# Patient Record
Sex: Female | Born: 1950 | Race: White | Hispanic: No | Marital: Married | Smoking: Never smoker
Health system: Southern US, Community
[De-identification: ages and names within clinical notes are randomized; demographics above are authoritative.]

## PROBLEM LIST (undated history)

## (undated) DIAGNOSIS — E559 Vitamin D deficiency, unspecified: Secondary | ICD-10-CM

## (undated) DIAGNOSIS — R51 Headache: Secondary | ICD-10-CM

## (undated) DIAGNOSIS — K219 Gastro-esophageal reflux disease without esophagitis: Secondary | ICD-10-CM

## (undated) DIAGNOSIS — M5090 Cervical disc disorder, unspecified, unspecified cervical region: Secondary | ICD-10-CM

## (undated) DIAGNOSIS — R112 Nausea with vomiting, unspecified: Secondary | ICD-10-CM

## (undated) DIAGNOSIS — I10 Essential (primary) hypertension: Secondary | ICD-10-CM

## (undated) DIAGNOSIS — R0602 Shortness of breath: Secondary | ICD-10-CM

## (undated) DIAGNOSIS — L723 Sebaceous cyst: Secondary | ICD-10-CM

## (undated) DIAGNOSIS — N200 Calculus of kidney: Secondary | ICD-10-CM

## (undated) DIAGNOSIS — W19XXXA Unspecified fall, initial encounter: Secondary | ICD-10-CM

## (undated) DIAGNOSIS — R32 Unspecified urinary incontinence: Secondary | ICD-10-CM

## (undated) DIAGNOSIS — Z85828 Personal history of other malignant neoplasm of skin: Secondary | ICD-10-CM

## (undated) DIAGNOSIS — R058 Other specified cough: Secondary | ICD-10-CM

## (undated) DIAGNOSIS — R05 Cough: Secondary | ICD-10-CM

## (undated) DIAGNOSIS — Z9889 Other specified postprocedural states: Secondary | ICD-10-CM

## (undated) DIAGNOSIS — I2510 Atherosclerotic heart disease of native coronary artery without angina pectoris: Secondary | ICD-10-CM

## (undated) DIAGNOSIS — G4730 Sleep apnea, unspecified: Secondary | ICD-10-CM

## (undated) DIAGNOSIS — M1990 Unspecified osteoarthritis, unspecified site: Secondary | ICD-10-CM

## (undated) DIAGNOSIS — M797 Fibromyalgia: Secondary | ICD-10-CM

## (undated) DIAGNOSIS — R3129 Other microscopic hematuria: Secondary | ICD-10-CM

## (undated) DIAGNOSIS — F419 Anxiety disorder, unspecified: Secondary | ICD-10-CM

## (undated) DIAGNOSIS — M1710 Unilateral primary osteoarthritis, unspecified knee: Secondary | ICD-10-CM

## (undated) HISTORY — DX: Cervical disc disorder, unspecified, unspecified cervical region: M50.90

## (undated) HISTORY — PX: DILATION AND CURETTAGE OF UTERUS: SHX78

## (undated) HISTORY — DX: Unspecified urinary incontinence: R32

## (undated) HISTORY — PX: ROTATOR CUFF REPAIR: SHX139

## (undated) HISTORY — PX: CYST EXCISION: SHX5701

## (undated) HISTORY — DX: Vitamin D deficiency, unspecified: E55.9

## (undated) HISTORY — DX: Unspecified fall, initial encounter: W19.XXXA

## (undated) HISTORY — PX: JOINT REPLACEMENT: SHX530

## (undated) HISTORY — DX: Atherosclerotic heart disease of native coronary artery without angina pectoris: I25.10

## (undated) HISTORY — DX: Sebaceous cyst: L72.3

## (undated) HISTORY — PX: TONSILLECTOMY: SUR1361

## (undated) HISTORY — DX: Unilateral primary osteoarthritis, unspecified knee: M17.10

---

## 2001-02-28 ENCOUNTER — Encounter: Payer: Self-pay | Admitting: Obstetrics and Gynecology

## 2001-02-28 ENCOUNTER — Encounter: Admission: RE | Admit: 2001-02-28 | Discharge: 2001-02-28 | Payer: Self-pay | Admitting: Obstetrics and Gynecology

## 2001-03-07 ENCOUNTER — Encounter: Payer: Self-pay | Admitting: Obstetrics and Gynecology

## 2001-03-07 ENCOUNTER — Encounter: Admission: RE | Admit: 2001-03-07 | Discharge: 2001-03-07 | Payer: Self-pay | Admitting: Obstetrics and Gynecology

## 2001-10-03 ENCOUNTER — Encounter: Payer: Self-pay | Admitting: Obstetrics and Gynecology

## 2001-10-03 ENCOUNTER — Encounter: Admission: RE | Admit: 2001-10-03 | Discharge: 2001-10-03 | Payer: Self-pay | Admitting: Obstetrics and Gynecology

## 2001-10-11 ENCOUNTER — Encounter: Admission: RE | Admit: 2001-10-11 | Discharge: 2001-10-11 | Payer: Self-pay | Admitting: Obstetrics and Gynecology

## 2001-10-11 ENCOUNTER — Encounter: Payer: Self-pay | Admitting: Obstetrics and Gynecology

## 2001-11-13 ENCOUNTER — Encounter: Admission: RE | Admit: 2001-11-13 | Discharge: 2001-11-13 | Payer: Self-pay | Admitting: Obstetrics and Gynecology

## 2001-11-13 ENCOUNTER — Encounter: Payer: Self-pay | Admitting: Obstetrics and Gynecology

## 2004-06-28 ENCOUNTER — Encounter: Admission: RE | Admit: 2004-06-28 | Discharge: 2004-06-28 | Payer: Self-pay | Admitting: Obstetrics and Gynecology

## 2004-12-21 ENCOUNTER — Ambulatory Visit (HOSPITAL_COMMUNITY): Admission: AD | Admit: 2004-12-21 | Discharge: 2004-12-23 | Payer: Self-pay | Admitting: Orthopedic Surgery

## 2004-12-29 ENCOUNTER — Ambulatory Visit: Admission: RE | Admit: 2004-12-29 | Discharge: 2004-12-29 | Payer: Self-pay | Admitting: Orthopedic Surgery

## 2005-03-23 ENCOUNTER — Other Ambulatory Visit: Admission: RE | Admit: 2005-03-23 | Discharge: 2005-03-23 | Payer: Self-pay | Admitting: Obstetrics and Gynecology

## 2005-09-21 ENCOUNTER — Encounter: Admission: RE | Admit: 2005-09-21 | Discharge: 2005-09-21 | Payer: Self-pay | Admitting: Obstetrics and Gynecology

## 2006-03-01 ENCOUNTER — Ambulatory Visit (HOSPITAL_COMMUNITY): Admission: RE | Admit: 2006-03-01 | Discharge: 2006-03-01 | Payer: Self-pay | Admitting: Orthopedic Surgery

## 2006-10-22 ENCOUNTER — Encounter: Admission: RE | Admit: 2006-10-22 | Discharge: 2006-10-22 | Payer: Self-pay | Admitting: Obstetrics and Gynecology

## 2007-02-04 ENCOUNTER — Ambulatory Visit: Payer: Self-pay | Admitting: Pulmonary Disease

## 2007-02-04 ENCOUNTER — Ambulatory Visit: Payer: Self-pay | Admitting: Cardiovascular Disease

## 2007-02-04 ENCOUNTER — Inpatient Hospital Stay (HOSPITAL_COMMUNITY): Admission: RE | Admit: 2007-02-04 | Discharge: 2007-02-09 | Payer: Self-pay | Admitting: Orthopedic Surgery

## 2007-02-21 ENCOUNTER — Ambulatory Visit: Payer: Self-pay | Admitting: Pulmonary Disease

## 2007-02-22 ENCOUNTER — Ambulatory Visit (HOSPITAL_BASED_OUTPATIENT_CLINIC_OR_DEPARTMENT_OTHER): Admission: RE | Admit: 2007-02-22 | Discharge: 2007-02-22 | Payer: Self-pay | Admitting: Pulmonary Disease

## 2007-02-22 ENCOUNTER — Ambulatory Visit: Payer: Self-pay | Admitting: Pulmonary Disease

## 2007-03-04 ENCOUNTER — Encounter: Admission: RE | Admit: 2007-03-04 | Discharge: 2007-03-18 | Payer: Self-pay | Admitting: Orthopedic Surgery

## 2007-03-19 ENCOUNTER — Encounter: Admission: RE | Admit: 2007-03-19 | Discharge: 2007-06-17 | Payer: Self-pay | Admitting: Orthopedic Surgery

## 2007-04-29 ENCOUNTER — Ambulatory Visit (HOSPITAL_BASED_OUTPATIENT_CLINIC_OR_DEPARTMENT_OTHER): Admission: RE | Admit: 2007-04-29 | Discharge: 2007-04-29 | Payer: Self-pay | Admitting: Orthopedic Surgery

## 2007-05-21 ENCOUNTER — Encounter: Admission: RE | Admit: 2007-05-21 | Discharge: 2007-08-19 | Payer: Self-pay | Admitting: Orthopedic Surgery

## 2008-03-13 ENCOUNTER — Encounter: Admission: RE | Admit: 2008-03-13 | Discharge: 2008-03-13 | Payer: Self-pay | Admitting: Obstetrics and Gynecology

## 2008-03-23 ENCOUNTER — Other Ambulatory Visit: Admission: RE | Admit: 2008-03-23 | Discharge: 2008-03-23 | Payer: Self-pay | Admitting: Obstetrics and Gynecology

## 2009-04-12 ENCOUNTER — Other Ambulatory Visit: Admission: RE | Admit: 2009-04-12 | Discharge: 2009-04-12 | Payer: Self-pay | Admitting: Obstetrics and Gynecology

## 2010-02-11 ENCOUNTER — Ambulatory Visit (HOSPITAL_BASED_OUTPATIENT_CLINIC_OR_DEPARTMENT_OTHER): Admission: RE | Admit: 2010-02-11 | Discharge: 2010-02-11 | Payer: Self-pay | Admitting: General Surgery

## 2010-04-13 ENCOUNTER — Other Ambulatory Visit: Admission: RE | Admit: 2010-04-13 | Discharge: 2010-04-13 | Payer: Self-pay | Admitting: Obstetrics and Gynecology

## 2010-12-05 LAB — DIFFERENTIAL
Lymphocytes Relative: 30 % (ref 12–46)
Monocytes Absolute: 0.6 10*3/uL (ref 0.1–1.0)
Monocytes Relative: 7 % (ref 3–12)
Neutro Abs: 5.2 10*3/uL (ref 1.7–7.7)
Neutrophils Relative %: 61 % (ref 43–77)

## 2010-12-05 LAB — CBC
HCT: 41.3 % (ref 36.0–46.0)
MCHC: 33.3 g/dL (ref 30.0–36.0)
MCV: 92.3 fL (ref 78.0–100.0)
Platelets: 267 10*3/uL (ref 150–400)
RDW: 13.2 % (ref 11.5–15.5)

## 2010-12-05 LAB — BASIC METABOLIC PANEL
Calcium: 9 mg/dL (ref 8.4–10.5)
GFR calc Af Amer: >60 mL/min (ref >60)
Glucose, Bld: 147 mg/dL — ABNORMAL HIGH (ref 70–99)
Potassium: 4.1 mEq/L (ref 3.5–5.1)

## 2010-12-05 LAB — GLUCOSE, CAPILLARY: Glucose-Capillary: 190 mg/dL — ABNORMAL HIGH (ref 70–99)

## 2010-12-07 ENCOUNTER — Encounter (HOSPITAL_BASED_OUTPATIENT_CLINIC_OR_DEPARTMENT_OTHER)
Admission: RE | Admit: 2010-12-07 | Discharge: 2010-12-07 | Disposition: A | Discharge status: Home / Self Care | Payer: Worker's Compensation | Source: Ambulatory Visit | Attending: Orthopedic Surgery | Admitting: Orthopedic Surgery

## 2010-12-07 LAB — BASIC METABOLIC PANEL
BUN: 11 mg/dL (ref 6–23)
Calcium: 9.2 mg/dL (ref 8.4–10.5)
Creatinine, Ser: 0.58 mg/dL (ref 0.4–1.2)
GFR calc non Af Amer: >60 mL/min (ref >60)
Potassium: 4.8 mEq/L (ref 3.5–5.1)

## 2010-12-08 ENCOUNTER — Ambulatory Visit (HOSPITAL_BASED_OUTPATIENT_CLINIC_OR_DEPARTMENT_OTHER)
Admission: RE | Admit: 2010-12-08 | Discharge: 2010-12-08 | Disposition: A | Discharge status: Home / Self Care | Payer: Worker's Compensation | Source: Ambulatory Visit | Attending: Orthopedic Surgery | Admitting: Orthopedic Surgery

## 2010-12-08 DIAGNOSIS — M24119 Other articular cartilage disorders, unspecified shoulder: Secondary | ICD-10-CM | POA: Insufficient documentation

## 2010-12-08 DIAGNOSIS — Z01812 Encounter for preprocedural laboratory examination: Secondary | ICD-10-CM | POA: Insufficient documentation

## 2010-12-08 DIAGNOSIS — I10 Essential (primary) hypertension: Secondary | ICD-10-CM | POA: Insufficient documentation

## 2010-12-08 DIAGNOSIS — G4730 Sleep apnea, unspecified: Secondary | ICD-10-CM | POA: Insufficient documentation

## 2010-12-08 DIAGNOSIS — M25819 Other specified joint disorders, unspecified shoulder: Secondary | ICD-10-CM | POA: Insufficient documentation

## 2010-12-08 DIAGNOSIS — M19019 Primary osteoarthritis, unspecified shoulder: Secondary | ICD-10-CM | POA: Insufficient documentation

## 2010-12-08 DIAGNOSIS — E119 Type 2 diabetes mellitus without complications: Secondary | ICD-10-CM | POA: Insufficient documentation

## 2010-12-08 DIAGNOSIS — E669 Obesity, unspecified: Secondary | ICD-10-CM | POA: Insufficient documentation

## 2010-12-08 LAB — GLUCOSE, CAPILLARY: Glucose-Capillary: 164 mg/dL — ABNORMAL HIGH (ref 70–99)

## 2010-12-09 NOTE — Op Note (Signed)
NAME:  Denise [name], Denise [name]              ACCOUNT NO.:  [id number]  MEDICAL RECORD NO.:  [id number]          PATIENT TYPE:  LOCATION:                                 FACILITY:  PHYSICIAN:  Jones Broom, MD         DATE OF BIRTH:22-May-2051  DATE OF PROCEDURE:  12/08/2010 DATE OF DISCHARGE:                              OPERATIVE REPORT   PREOPERATIVE DIAGNOSES: 1. Right shoulder impingement. 2. Right shoulder acromioclavicular joint degenerative joint disease.  POSTOPERATIVE DIAGNOSES: 1. Right shoulder impingement. 2. Right shoulder acromioclavicular joint degenerative disease. 3. Right shoulder type 2 superior labrum anterior and posterior tear.  PROCEDURES PERFORMED: 1. Right arthroscopic distal clavicle excision. 2. Right arthroscopic subacromial decompression. 3. Right arthroscopic biceps tenodesis and debridement of type 2     superior labrum anterior and posterior tear and degenerative     anterior labral tear and partial-thickness rotator cuff tear.  ATTENDING SURGEON:  Jones Broom, MD  ASSISTANT:  Jason Coop  ANESTHESIA:  GETA with preoperative interscalene block.  COMPLICATIONS:  None.  DRAINS:  None.  SPECIMENS:  None.  ESTIMATED BLOOD LOSS:  Minimal.  INDICATIONS FOR SURGERY:  The patient is a 60 year-old female who was injured at work in March 2009.  She had continued pain in the right shoulder which failed nonoperative management with icing, rest, anti- inflammatories, injections and physical therapy.  She had an MRI which demonstrated some partial-thickness rotator cuff tearing and signs of chronic impingement with a large anterior acromial spur as well as significant AC joint degenerative changes.  She is indicated for arthroscopic exam with subacromial decompression, distal clavicle excision and possible rotator cuff repair and other findings as indicated.  She understood risks, benefits and alternatives of the procedure including but not limited to  the risk of bleeding, infection, damage to neurovascular structures and risk of anesthesia.  She elected to go forward with surgery.  OPERATIVE FINDINGS:  Examination under anesthesia demonstrated no stiffness or instability.  Diagnostic arthroscopy revealed extensive tearing of the superior labrum which was completely detached from the biceps anchor at the superior glenoid tubercle.  The proximal aspect of the biceps tendon was hyperemic.  No loose bodies were noted in the joint.  Glenohumeral joint surfaces were intact and no significant chondromalacia.  The subscapularis was intact anteriorly.  Posterior rotator cuff was intact.  Superior rotator cuff had some partial- thickness undersurface tearing which was debrided back to stable base. A PDS suture was used to tag the area of most significant partial- thickness tearing to look at the bursal surface.  In the bursal surface, she was noted to have extensive bursitis which was debrided exposing the underlying bursal surface of rotator cuff which was carefully examined. The PDS suture was found and the rotator cuff was carefully probed and this area found to be completely intact with no significant thinning. Anterior and posterior rotator cuff were carefully examined and found to be completely intact.  She was noted to have a very large anterior acromial spur which was taken down with standard acromioplasty from the lateral portal and then from the posterior portal in a  cutting block technique ensuring that the acromion was completely flat from posterior to anterior.  AC joint was found to be significant degenerative and standard distal clavicle excision was performed.  PROCEDURE PERFORMED:  The patient was identified in the preoperative holding area where I personally marked out the operative site after verifying site side and procedure with the patient.  She was taken back to the operating room after interscalene block was given by  the attending anesthesiologist.  She was placed on the operative room table and general anesthesia was induced without complication.  She was placed in a beach-chair position with the head and neck and opposite extremity carefully padded and positioned.  Right upper extremity was then prepped and draped in a standard sterile fashion.  The patient did receive 2 g IV Ancef prior to the procedure.  At appropriate time-out, procedure was carried out myself, the operative staff and the anesthesia staff all verifying site side and procedure.  Standard posterior portal was then established and the arthroscope was introduced into the joint.  Anterior portal was then established with needle localization.  A small cannula was placed just above the subscapularis.  Diagnostic arthroscopy was then carried out with findings as described above.  Collene Mares was introduced through the anterior portal to debride the superior labrum. Probe was used to demonstrate the superior labrum was completely detached from the supraglenoid tubercle and therefore given the findings of the biceps tendon as well, decision was made to perform biceps tenotomy rather than considering a superior labral repair in this patient with her age group.  Tenotomy was carried out with a large basket.  The remainder of the arthroscopic exam as described above. Collene Mares was used through the anterior portal, debrided partial-thickness undersurface rotator cuff tearing.  There was not significant exposed tuberosity.  A PDS tag stitch was used percutaneously to tack the area of greatest concern and the arthroscope was then introduced in the subacromial space.  Extensive bursectomy was performed through lateral portal which was established with needle localization.  The PDS suture was found in the area and the anterior rotator cuff was carefully probed and found to have no significant thinning.  No rotator cuff repair was deemed necessary.  The  entire cuff was exposed and felt to be completely intact on the bursal surface.  The coracoacromial ligament was then taken down exposing a very large anterior acromial spur.  A 4-mm bur was used from the lateral port to carry out a standard acromioplasty creating a type 1 acromion from a type 3 acromion.  The bur was used through the posterior portal viewing through lateral portal as well in a cutting block technique to ensure that the acromion was completely flat from the back to the front.  The distal clavicle was then exposed using combination of shaver and [employer]Care.  The anterior cannula was then moved to the [facility] joint.  The bur was first used to the lateral portal to take off the undersurface of the distal clavicle for a distance of about 8 mm and then through the anterior portal to complete the resection in a smooth even fashion.  The final resection was viewed from anterolateral and posterior portals was then felt to be excellent.  The shaver was then used again to clean up any excess bone dust in the joint and arthroscopic equipment was then removed from the joint.  Portals were closed with 3-0 nylon in interrupted fashion.  Sterile dressings were then applied including Xeroform, 4x4s, ABDs  and tape.  The patient was placed in a sling, allowed to awaken from general anesthesia, transferred to stretcher and taken to the recovery room in stable condition.  POSTOPERATIVE PLAN:  She will be discharged to home today in stable condition with her family.  She will follow up in 1 week for suture removal and wound check.  She will have Percocet for pain control.     Jones Broom, MD     JC/MEDQ  D:  12/08/2010  T:  12/09/2010  Job:  [id number]  Electronically Signed by Jones Broom  on 12/09/2010 11:18:52 AM

## 2010-12-21 ENCOUNTER — Ambulatory Visit: Payer: Worker's Compensation | Attending: Orthopedic Surgery | Admitting: Rehabilitation

## 2010-12-21 DIAGNOSIS — M25619 Stiffness of unspecified shoulder, not elsewhere classified: Secondary | ICD-10-CM | POA: Insufficient documentation

## 2010-12-21 DIAGNOSIS — M6281 Muscle weakness (generalized): Secondary | ICD-10-CM | POA: Insufficient documentation

## 2010-12-21 DIAGNOSIS — IMO0001 Reserved for inherently not codable concepts without codable children: Secondary | ICD-10-CM | POA: Insufficient documentation

## 2010-12-21 DIAGNOSIS — M25519 Pain in unspecified shoulder: Secondary | ICD-10-CM | POA: Insufficient documentation

## 2010-12-26 ENCOUNTER — Ambulatory Visit: Payer: Worker's Compensation | Admitting: Physical Therapy

## 2011-01-06 ENCOUNTER — Ambulatory Visit: Payer: Worker's Compensation | Attending: Orthopedic Surgery | Admitting: Physical Therapy

## 2011-01-06 DIAGNOSIS — M25519 Pain in unspecified shoulder: Secondary | ICD-10-CM | POA: Insufficient documentation

## 2011-01-06 DIAGNOSIS — M6281 Muscle weakness (generalized): Secondary | ICD-10-CM | POA: Insufficient documentation

## 2011-01-06 DIAGNOSIS — M25619 Stiffness of unspecified shoulder, not elsewhere classified: Secondary | ICD-10-CM | POA: Insufficient documentation

## 2011-01-06 DIAGNOSIS — IMO0001 Reserved for inherently not codable concepts without codable children: Secondary | ICD-10-CM | POA: Insufficient documentation

## 2011-01-09 ENCOUNTER — Ambulatory Visit: Payer: Worker's Compensation | Admitting: Physical Therapy

## 2011-01-12 ENCOUNTER — Ambulatory Visit: Payer: Worker's Compensation | Admitting: Physical Therapy

## 2011-01-17 ENCOUNTER — Ambulatory Visit: Payer: Worker's Compensation | Attending: Orthopedic Surgery | Admitting: Physical Therapy

## 2011-01-17 DIAGNOSIS — M25619 Stiffness of unspecified shoulder, not elsewhere classified: Secondary | ICD-10-CM | POA: Insufficient documentation

## 2011-01-17 DIAGNOSIS — M6281 Muscle weakness (generalized): Secondary | ICD-10-CM | POA: Insufficient documentation

## 2011-01-17 DIAGNOSIS — M25519 Pain in unspecified shoulder: Secondary | ICD-10-CM | POA: Insufficient documentation

## 2011-01-17 DIAGNOSIS — IMO0001 Reserved for inherently not codable concepts without codable children: Secondary | ICD-10-CM | POA: Insufficient documentation

## 2011-01-19 ENCOUNTER — Ambulatory Visit: Payer: Worker's Compensation | Admitting: Physical Therapy

## 2011-01-23 ENCOUNTER — Ambulatory Visit: Payer: Worker's Compensation | Admitting: Physical Therapy

## 2011-01-23 ENCOUNTER — Ambulatory Visit: Payer: Worker's Compensation | Attending: Orthopedic Surgery | Admitting: Physical Therapy

## 2011-01-23 DIAGNOSIS — M25519 Pain in unspecified shoulder: Secondary | ICD-10-CM | POA: Insufficient documentation

## 2011-01-23 DIAGNOSIS — M6281 Muscle weakness (generalized): Secondary | ICD-10-CM | POA: Insufficient documentation

## 2011-01-23 DIAGNOSIS — IMO0001 Reserved for inherently not codable concepts without codable children: Secondary | ICD-10-CM | POA: Insufficient documentation

## 2011-01-23 DIAGNOSIS — M25619 Stiffness of unspecified shoulder, not elsewhere classified: Secondary | ICD-10-CM | POA: Insufficient documentation

## 2011-01-26 ENCOUNTER — Ambulatory Visit: Payer: Worker's Compensation | Admitting: Physical Therapy

## 2011-01-30 ENCOUNTER — Ambulatory Visit: Payer: Worker's Compensation | Admitting: Physical Therapy

## 2011-01-30 ENCOUNTER — Encounter: Payer: Self-pay | Admitting: Physical Therapy

## 2011-01-31 NOTE — Assessment & Plan Note (Signed)
[facility]                             PULMONARY OFFICE NOTE   NAME:Diffley, LYNISHA OSUCH                         MRN:          [id number]  DATE:03/27/2007                            DOB:          1951-04-09    I received download for Ms. Mullenax for [employer]. It appears that she had  excellent use with a 95th percentile pressure setting of 9 CMW with a  reduction in her apnea/hypopnea index of 1.7.  Ms. Shon says that she has  been doing very well on her CPAP.   I will therefore arrange for [facility] to set her CPAP machine at a fixed  pressure setting of 9 and follow up with her in the next several weeks  to further assess her tolerance for CPAP therapy.     Coralyn Helling, MD  Electronically Signed    VS/MedQ  DD: 03/26/2007  DT: 03/27/2007  Job #: [id number]   cc:   Deirdre Peer. Polite, M.D.  Gerrit Friends. Aldona Bar, M.D.

## 2011-01-31 NOTE — Procedures (Signed)
NAME:  Denise [name], Denise [name]                  ACCOUNT NO.:  [id number]   MEDICAL RECORD NO.:  [id number]          PATIENT TYPE:  OUT   LOCATION:  SLEEP CENTER                 FACILITY:  [facility]   PHYSICIAN:  Coralyn Helling, MD        DATE OF BIRTH:  [birth year]   DATE OF STUDY:                            NOCTURNAL POLYSOMNOGRAM   REFERRING PHYSICIAN:  Coralyn Helling, MD   INDICATION FOR STUDY:  This individual has a history of obstructive  sleep apnea and hypoxemia as well as hypertension and diabetes. She is  referred to the sleep lab for further evaluation of her sleep apnea.   EPWORTH SLEEPINESS SCORE:  Eight.   MEDICATIONS:  Allegra, Metformin, Quinapril, Norvasc.  The patient took  methocarbamol and Oxycodone on the night of the study.   SLEEP ARCHITECTURE:  Total recording time was 440 minutes.  Total sleep  time was 260 minutes.  Sleep efficiency was 59% which is reduced. Sleep  latency was 35 minutes which is prolonged.  REM latency was 128.5  minutes.  It appeared the patient had difficulty with sleep initiation  and sleep maintenance due to respiratory events.  The patient was  observed in all stages of sleep. The patient slept predominantly in the  non supine position.   RESPIRATORY DATA:  The average respiratory rate was 24.  The overall  apnea/hypopnea index was 8.5.  The events were exclusively obstructive  in nature.  Moderate snoring was noted by the technician.  The supine  apnea/hypopnea index was 10.  The non supine apnea/hypopnea index was  8.5. The REM apnea/hypopnea index was 4.  The non REM apnea/hypopnea  index was 6.3.   OXYGEN DATA:  The baseline oxygenation was 94%.  The oxygen saturation  nadir was 83%.  The patient spent a total of 241 minutes with an oxygen  saturation between 91-100% and 196 minutes with an oxygen saturation  between 81-90%.   CARDIAC DATA:  The average heart rate was 84 and the rhythm strip showed  normal sinus rhythm.   MOVEMENT-PARASOMNIA:  A  periodic limb movement index was 2.8. No other  abnormal behaviors were observed.   IMPRESSIONS-RECOMMENDATIONS:  This study shows evidence for mild to  moderate obstructive sleep apnea as demonstrated by an apnea/popliteal  index of 8.5 and an oxygen saturation measured at 83%.  She was  originally scheduled for a split night study protocol but did not meet  that protocol criteria.  To further assess her sleep apnea,  she can be scheduled for an auto CPAP titration study at home with  overnight oximetry versus an in lab CPAP sleep titration study.      Coralyn Helling, MD  Diplomat, American Board of Sleep Medicine  Electronically Signed     VS/MEDQ  D:  03/02/2007 10:44:59  T:  03/02/2007 21:02:30  Job:  [id number]

## 2011-01-31 NOTE — Assessment & Plan Note (Signed)
[facility]                             PULMONARY OFFICE NOTE   NAME:[name], Denise ROSSMANN                         MRN:          [id number]  DATE:02/21/2007                            DOB:          [birth year]    I saw Denise [name] in followup today for her obstructive sleep apnea and  hypoxemia.   Since her hospital discharge, she had been doing reasonably well. She  denies any complaints of chest pains, coughing, wheezing, sputum  production or palpitations. She says that her exercise currently is  limited by the pain and weakness in her leg after her surgery but that  she does not feel like she is limited with regards to her breathing.   CURRENT MEDICATIONS:  1. Metformin 1000 mg b.i.d.  2. Accupril.  3. Norvasc 10 mg daily.  4. Allegra 180 mg daily.  5. Coumadin as directed.  6. Oxycodone as needed.   ALLERGIES:  SULFA.   PHYSICAL EXAMINATION:  VITAL SIGNS:  She is 5 feet 6 inches tall, 258  pounds. Temperature is 97.9, blood pressure is 114/66, heart rate is  100. Oxygen saturation is 96% on room air.  HEENT:  There is no sinus tenderness, no nasal discharge. She has  Mallampati 3 airway, no oral lesions, no lymphadenopathy.  HEART:  S1, S2, regular rate and rhythm.  CHEST:  Clear to auscultation.  ABDOMEN:  Soft, nontender, positive bowel sounds.  EXTREMITIES:  No edema.   Pulmonary function test in my office today showed a post bronchodilator  FEV1/FVC ratio of 84%. Her FEV1 was 2.97 liters which was 117% of  predicted. Her FVC was 3.54 which was 104% of predicted. There is no  significant bronchodilator response. Her total lung capacity was 5.19  liters which was 97% of predicted. Her diffusion capacity was 80% or  predicted.  These would be consistent with essentially normal pulmonary  function tests. She did however have a truncation in the inspiratory  limb of her flow-volume loop which may be consistent with her underlying  obstructive  sleep apnea.   IMPRESSION:  1. Hypoxemia. I suspect again that this likely related to her      postoperative state with atelectasis and underlying use of pain      medications as well as pneumonia. She has since stopped using her      antibiotics and does not appear to have any symptoms related to      this at this present time. Additionally, her pulmonary function      tests were completely normal and therefore it would be unlikely for      her to have an underlying parenchymal lung disorder which would be      contributing to her recent episode of hypoxemia.  2. Obstructive sleep apnea. She is to undergo an overnight      polysomnogram on June 6. After I have an opportunity to review      this, I would further adjust her PAP therapy as needed.  3. Knee arthritis. She is due to have surgery on her other  knee on      June 30. Given the findings of her pulmonary function test as well      as her current clinical status, I do not feel that there would be      any prohibitive risk for her to undergo this procedure. Again she      will just need to continue her PAP therapy for her sleep apnea as      well as have close monitoring of her oxygenation. Additionally it      would be ideal to minimize the amount of narcotic medications as      tolerated as well as to ambulate her as early as possible      postoperatively.     Coralyn Helling, MD  Electronically Signed    VS/MedQ  DD: 02/22/2007  DT: 02/22/2007  Job #: [id number]   cc:   Elana Alm. Thurston Hole, M.D.  Deirdre Peer. Polite, M.D.

## 2011-01-31 NOTE — Consult Note (Signed)
NAME:  Denise [name], Denise [name]                  ACCOUNT NO.:  [id number]   MEDICAL RECORD NO.:  [id number]          PATIENT TYPE:  INP   LOCATION:  5037                         FACILITY:  [facility]   PHYSICIAN:  Coralyn Helling, MD        DATE OF BIRTH:  02/27/1951   DATE OF CONSULTATION:  02/06/2007  DATE OF DISCHARGE:                                 CONSULTATION   REFERRING PHYSICIAN:  Elana Alm. Thurston Hole, M.D.   REASON FOR CONSULTATION:  Hypoxemia.   HISTORY OF PRESENT ILLNESS:  Denise [name] is a 60 year old female who was  admitted on May 19 to undergo left total knee replacement.  Apparently,  her operative course was uneventful.  However, it was noted that  postoperatively Denise [name] had an episode of nausea and vomiting, although  there is nothing for what I can gather to suggest that Denise [name] had  aspirated.  Denise [name] was noted to have a oxygen saturation of 70%.  Denise [name] was  wearing BiPAP at the time, but was not receiving supplemental oxygen.  Denise [name] was then placed on supplemental oxygen and apparently her  oxygenation had improved.  Denise [name] has continued to require increased  amounts of supplemental oxygen as well as requiring the use of BiPAP  intermittently during the day as well as an continuously at night.  Denise [name]  does have a history of obstructive sleep apnea and is on positive airway  pressure therapy at home for this.  Denise [name] says that, approximately 2 weeks  ago, Denise [name] was treated with Levaquin for a sinus infection which partially  improve her symptoms, but that Denise [name] was still having nasal congestion  with postnasal drip.  Over the last 2-3 days, Denise [name] has also been having  chest congestion with cough productive of clear to yellowish phlegm.  Denise [name] also had a fever up to 101.1 yesterday.  Denise [name] denies having any chest  pains or palpitations.  Denise [name] has not had any wheezing.  Denise [name] does have  problems with seasonal allergies.   PAST MEDICAL HISTORY:  1. Obesity.  2. Obstructive sleep apnea.  3. Degenerative joint disease.  4.  Hypertension.  5. Diabetes.  6. Allergic rhinitis.   PAST SURGICAL HISTORY:  1. Tonsillectomy.  2. D&C.  3. Arthroscopy of the left knee.   OUTPATIENT MEDICATIONS:  1. Metformin 1000 mg b.i.d.  2. Accupril 20 mg daily.  3. Norvasc 5 mg daily.  4. Allegra.  5. Tylenol  6. TUMS.   CURRENT INPATIENT MEDICATIONS:  1. Colace.  2. Coumadin.  3. Glucophage.  4. Lovenox.  5. Norvasc.  6. Prinivil.  7. Tylenol.   ALLERGIES:  SULFA.   SOCIAL HISTORY:  Denise [name] is married.  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  There is no history of tobacco or alcohol use.   FAMILY HISTORY:  Significant for her father who had lung cancer and  prostate cancer.   REVIEW OF SYSTEMS:  Denise [name] still complains of nausea, but has not had any  further episodes of vomiting.  Denise [name] denies any abdominal pain or  diarrhea.  Denise [name] also denies any dysuria.  PHYSICAL EXAMINATION:  VITAL SIGNS:  Temperature maximum was 101.1,  blood pressure is 100/56, respiratory rate 10, heart rate is one 110,  oxygen saturation on auto-BiPAP of 5 liters nasal cannula is 90%.  This  dropped down to 81% after approximately one minute with which Denise [name]  removed to BiPAP.  HEENT:  Pupils are reactive.  There is no sinus tenderness.  Denise [name] has a  clear nasal discharge.  Denise [name] has a Mallampati 3 airway.  There is no  lymphadenopathy, no thyromegaly.  HEART:  S1-S2 regular rhythm.  CHEST:  Denise [name] has bilateral rales.  There is no wheezing.  Denise [name] had  decreased air entry.  ABDOMEN:  Obese, soft, tender, positive bowel sounds.  EXTREMITIES:  There was no edema.  NEUROLOGIC:  Denise [name] was somnolent, but Denise [name] just received pain medications.  Otherwise, Denise [name] was able to follow commands appropriately and move all  for extremities.  Her most recent laboratory results show a pH 7.38,  pCO2 is 45.9, pO2 was 72.6 and this was on nasal cannula a 5 liters.  Hemoglobin was 9.6, hematocrit was 28.1, platelet count was 228.  WBC  was 7.8, sodium 138, potassium 4.1,  chloride is 106, CO2 is 30, BUN is  4, creatinine 0.5, glucose is 165.  INR is 1.3.  Troponin was 0.05.  CK  was 551, CK-MB was 0.8, calcium was 8.2, glycosylated hemoglobin 7.2.  the chest x-ray from today shows mild cardiomegaly with mild vascular  congestion CT scan of the chest from today shows no evidence for  pulmonary embolism.  There is patchy ground-glass opacities noted more  prominent in the lung peripheries, and more prominent in the right lower  lobe superior segment.  Denise [name] also had a 9-mm nodule in the left lower  lobe as well as hilar adenopathy which was nonspecific.   IMPRESSION:  1. Hypoxemia.  This is likely multifactorial.  Denise [name] probably had some      degree of atelectasis as well as vascular congestion.  Denise [name] may also      be developing early stages of pneumonia.  What I would do for this      for now is continue her on supplemental oxygen as well as BiPAP to      maintain her oxygen saturations greater than 90%.  I would also      start her on nebulizer treatments as well as flutter valve and      continue her on incentive spirometry.  I would have her remain in      bed rest for today, but then after that would increase her activity      level as tolerated  2. Obesity with obstructive sleep apnea.  Denise [name] is to continue on BiPAP      therapy.  3. History of allergic rhinitis with recent sinus infection and fever      with possible pneumonia.  I will start her on the Nasonex nasal      sprays.  I will start her on Avelox, and follow up on her chest x-      ray.  4. Left lower lobe nodule with nonspecific hilar adenopathy.  Again,      this is of uncertain significance.  Denise [name] will likely need to have      follow-up CT scan of her chest in 3-4 months, but I certainly do      not think that any intervention would need to be done for this at  the present time.  5. Mild pulmonary vascular congestion.  I will give her one dose of     Lasix and follow up on a chest x-ray.   I will      also check her BNP and erythrocyte sedimentation rate.  6. Diabetes.  Denise [name] is on oral hypoglycemic agents as well as on sliding      scale insulin.  7. Anemia.  This is likely related to her postoperative state, but      will follow up on her hemoglobin.      Coralyn Helling, MD  Electronically Signed     VS/MEDQ  D:  02/06/2007  T:  02/06/2007  Job:  [id number]

## 2011-01-31 NOTE — H&P (Signed)
NAME:  [name], Denise                  ACCOUNT NO.:  [id number]   MEDICAL RECORD NO.:  [id number]          PATIENT TYPE:  AMB   LOCATION:  DSC                          FACILITY:  [facility]   PHYSICIAN:  Robert A. Thurston Hole, M.D. DATE OF BIRTH:  1951-03-23   DATE OF ADMISSION:  04/29/2007  DATE OF DISCHARGE:                              HISTORY & PHYSICAL   PREOPERATIVE DIAGNOSIS:  Left total knee arthrofibrosis.   POSTOPERATIVE DIAGNOSIS:  Left total knee arthrofibrosis.   PROCEDURE:  Left total knee examine under anesthesia followed by  manipulation and cortisone injection under anesthesia.   SURGEON:  Elana Alm. Thurston Hole, M.D.   ANESTHESIA:  General.   OPERATIVE TIME:  5 minutes.   COMPLICATIONS:  None.   INDICATIONS FOR THE PROCEDURE:  Ms. Bibbins is a 60 year old woman who was  almost 3 months status post left total knee replacement with significant  postoperative arthrofibrosis despite intensive physical therapy.  She  has failed to regain sufficient range of motion and is now to undergo  EUA, manipulation and injection followed by intensive physical therapy  and postoperative CPM.   DESCRIPTION:  Ms. Zwilling was brought to the operating room on April 29, 2007, placed on the operating table in the supine position.  She had  received Ancef 1 g IV preoperatively for prophylaxis.  After being  placed under general anesthesia, her range of motion was tested.  Initial exam under anesthesia showed range of motion from -8 to 95  degrees.  A gentle manipulation was carried out, breaking up soft  adhesions and improving flexion of 125 degrees and extension of -3.  The  knee remained stable.  Ligamentous exam with normal patella tracking.  The knee was sterilely injected with 80 mg of Depo-Medrol and 10 mL of  0.25% Marcaine with epinephrine.  After this was done, it was felt that  all pathology had been satisfactorily addressed.  Ice packs were placed  on the knee.  She was awake and then taken to  recovery in stable  condition.   FOLLOWUP CARE:  Ms. Chiong will be followed up as an outpatient on Vicodin  for pain with early aggressive physical therapy as well as a home CPM  machine to keep this range of motion which we have successfully gained  here in the operating room.  She will be seen back in the office in a  week for recheck and followup.     Robert A. Thurston Hole, M.D.  Electronically Signed    RAW/MEDQ  D:  04/29/2007  T:  04/29/2007  Job:  [id number]

## 2011-01-31 NOTE — Assessment & Plan Note (Signed)
[facility]                             PULMONARY OFFICE NOTE   NAME:[name], Denise CAMPOY                         MRN:          [id number]  DATE:03/05/2007                            DOB:          1951/05/28    I had reviewed [name]'s sleep study which was done on February 23, 2007.   This showed an overall apnea-hypopnea index of 8.5 with an oxygen  saturation of 83%.  She did have a significant REM effect, but had very  minimal amount of supine asleep.  Therefore the severity of her sleep  apnea may be somewhat underestimated.   I had called her with the results of this and what I would like to do  now is have her undergo an auto-CPAP titration study for 2 weeks to  determine what would be the optimal pressure setting for her CPAP  machine as she is currently using a relative's CPAP machine.  Once she  is established on a optimal pressure for her CPAP machine I would like  her to undergo an overnight oximetry to determine if she would need to  use supplemental oxygen in addition to CPAP therapy, although I am  hopeful that she will not need to use supplemental oxygen.   She says that she has had her second knee surgery rescheduled for July  22.  I would plan on following up with her prior to this to determine if  any further interventions would need to be done prior to her undergoing  her knee surgery, although I am hopeful that by optimizing her CPAP  therapy this may be all that needs to be done.     Coralyn Helling, MD  Electronically Signed    VS/MedQ  DD: 03/05/2007  DT: 03/05/2007  Job #: [id number]   cc:   Molly Maduro A. Thurston Hole, M.D.  Deirdre Peer. Polite, M.D.

## 2011-01-31 NOTE — Discharge Summary (Signed)
NAME:  Denise [name], Denise [name]                  ACCOUNT NO.:  [id number]   MEDICAL RECORD NO.:  [id number]          PATIENT TYPE:  INP   LOCATION:  5037                         FACILITY:  [facility]   PHYSICIAN:  Molly Maduro A. Thurston Hole, M.D. DATE OF BIRTH:  01-Jul-1951   DATE OF ADMISSION:  02/04/2007  DATE OF DISCHARGE:  02/09/2007                               DISCHARGE SUMMARY   ADMITTING DIAGNOSES:  1. End-stage degenerative joint disease left knee.  2. Hypertension.  3. Diabetes.  4. Allergies.  5. Sleep apnea, with the use of a continuous positive airway pressure.   DISCHARGE DIAGNOSIS:  1. End-stage degenerative joint disease left knee, status post total      knee replacement.  2. Hypertension.  3. Diabetes.  4. Allergies.  5. Sleep apnea.  6. Posttraumatic pulmonary insufficiency.  7. Postoperative blood loss anemia.  8. Obesity.  9. Chronic obstructive pulmonary disease.  10.Hypoxemia.   HISTORY OF PRESENT ILLNESS:  This patient is a 60 year old white female  with a history of end-stage DJD of both knees, left is more painful than  right.  She has failed conservative care, including intraarticular  cortisone injections and physical therapy. Now she ambulates with a  cane.  Risks, benefits, and possible complications of a total knee  replacement were discussed with her and her husband.  She understands  these and is without question.   Postop day #0, the patient was placed on a BiPAP.  However, no O2 was  attached to the machine.  BiPAP settings were set 12/10, and 4 liters of  oxygen was added to the machine.  Her O2 saturations increased to 93%.  Her heart rate was at 111, her respiratory rate was at 20.  Rapid  response was called when she was desaturating, and respiratory therapy  was called.  Postop day #1, the patient's hemoglobin was 11.2, white  cell count was 12.6.  Her INR was 1.3.  Surgical wound was well  approximated.  She was neurovascularly intact.  The patient was given  Narcan overnight for respiratory depression and is significantly better  this morning.  Her IV was saline locked.  Her PCA was discontinued.  Her  Foley was discontinued.  She was given milk of magnesia.  Pulmonary was  called due to the patient's inability to maintain O2 saturations.  Postop day #2, T-max of 101.  White cell count was 10.8.  Her INR is  1.3.  She tolerated her CPM 0-65 degrees.  She is having a lot of  difficulty with physical therapy.  Chest x-ray was ordered due to fever  and decreased O2 saturations.  Postop day #3, patient still having  difficulty with physical therapy and now noncompliant with her CPM as  well.  She is struggling with difficulty as if she takes too much pain  medicine she dropped her saturations and her blood pressure blood, but  if she does not take pain medicine she is unable to participate in  physical therapy.  Postop day #4, the patient was significantly  improved.  Her surgical wound was well approximated.  Her INR was 1.9.  She was afebrile.  She was discharged to home on postop day #5, with a  hemoglobin of 9.8.  INR of 2.2.  Stable chest x-ray.  Her hypoxemia had  improved.  She will continue use a CPAP.  An O2 sat monitor was ordered  at home due to her multiple episodes of hypoxemia in the hospital.  Home  health nursing was ordered.  Home health BiPAP was ordered due to her  multiple episodes of hypoxemia, rather than just CPAP, and home health  oxygen was ordered.  Oxy-IR 5 mg 1 tablet q.4-6 hours p.r.n. pain;  Robaxin 500 mg 1 q.4 h. p.r.n. muscle spasm; Avelox 400 mg 1 tablet  daily; Coumadin 5 mg 1-1/2 tablets daily; metformin 1000 mg twice a day;  Nasonex 50 mcg 2 sprays daily; Accupril 20 mg daily, do not take if her  blood pressure is less than 120/80; Norvasc 5 mg daily, do not take if  blood pressure less than 120/80; albuterol inhaler with spacer, 2 puffs  4 times a day as needed; Colace 100 mg 1 tablet twice a day, Senokot-S 2   tablets with dinner if no BM in 2 days.  She had been instructed to  follow up with Dr. Thurston Hole on February 18, 2007.  She has been instructed  elevate her heel on a folded pillow 30 minutes twice a day, and do her  CPM 0-90 degrees 8 hours a day.      Kirstin Shepperson, P.A.      Robert A. Thurston Hole, M.D.  Electronically Signed    KS/MEDQ  D:  04/26/2007  T:  04/27/2007  Job:  [id number]

## 2011-02-01 ENCOUNTER — Ambulatory Visit: Payer: Worker's Compensation | Admitting: Physical Therapy

## 2011-02-03 NOTE — Op Note (Signed)
NAME:  Denise [name], Denise [name]                  ACCOUNT NO.:  [id number]   MEDICAL RECORD NO.:  [id number]          PATIENT TYPE:  OIB   LOCATION:  2550                         FACILITY:  [facility]   PHYSICIAN:  Robert A. Thurston Hole, M.D. 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   POSTOPERATIVE DIAGNOSIS:  Left knee lateral meniscus tear with  chondromalacia and synovitis.   PROCEDURE:  1.  Left knee EUA followed by arthroscopic partial lateral meniscectomy.  2.  Left knee chondroplasty with partial synovectomy.  3.  Left knee lateral retinacular release.   SURGEON:  Elana Alm. Thurston Hole, M.D.   ASSISTANT:  Julien Girt, P.A.   ANESTHESIA:  General.   OPERATIVE TIME:  45 minutes.   COMPLICATIONS:  None.   DESCRIPTION OF PROCEDURE:  Ms. Payano was brought to operating room on December 21, 2004 AND placed on the operating table in supine position.  After being  placed under general anesthesia, her left knee was examined.  She had a  range of motion from 0 to 130 degrees 1 to 2+ crepitation.  Knee stable to  ligamentous exam with slight lateral patellar tracking. The knee was  sterilely injected with 0.25% Marcaine with epinephrine. Left leg was then  prepped using sterile DuraPrep and draped using sterile technique.  Originally through an anterolateral portal, the arthroscope with a pump  attached was placed and through an anteromedial portal, an arthroscopic  probe was placed. On initial inspection of the medial compartment, she was  found have 30-40% grade 3 chondromalacia which was debrided. Medial meniscus  was intact.  Intercondylar notch inspected, anterior crest cruciate  ligaments are normal.  Lateral compartment inspected. She had 30% grade 3  chondromalacia on the lateral femoral condyle, 25% on the lateral  tibial  plateau. This was debrided. Lateral meniscus and partial tear 25 of 30%  posterior lateral wall and this was resected back to a stable rim.  Patellofemoral joint showed 75% grade 3 chondromalacia of patellofemoral  groove and this was debrided. There was moderate lateral patellar tracking  noted. Significant synovitis was noted in both the medial and lateral  gutters and this was thoroughly debrided. A lateral retinacular release was  carried out with an ArthroCare wand to decompress the patellofemoral joint  and improve patellar tracking. This was achieved without any excessive  bleeding noted. After this was done, it was felt that all pathology been  satisfactorily addressed. The instruments removed. Portals closed with 3-0  nylon suture and injected with 0.25% Marcaine with epinephrine and 4  milligrams morphine. Sterile dressings applied and the patient awakened and  taken to recovery room in stable condition.  Needle and sponge counts  correct x 2 at the end of the case.   FOLLOW-UP CARE:  Ms. Berens will be followed overnight because of sleep apnea  with close monitoring. She will be discharged tomorrow if stable. She will  be seen back in the office a week for sutures out and follow-up.       RAW/MEDQ  D:  12/21/2004  T:  12/21/2004  Job:  [id number]

## 2011-02-03 NOTE — Op Note (Signed)
NAME:  Denise [name], Denise [name]                  ACCOUNT NO.:  [id number]   MEDICAL RECORD NO.:  [id number]          PATIENT TYPE:  INP   LOCATION:  2550                         FACILITY:  [facility]   PHYSICIAN:  Robert A. Thurston Hole, M.D. DATE OF BIRTH:  May 15, 1951   DATE OF PROCEDURE:  02/04/2007  DATE OF DISCHARGE:                               OPERATIVE REPORT   PREOPERATIVE DIAGNOSIS:  Left knee degenerative joint disease.   POSTOPERATIVE DIAGNOSIS:  Left knee degenerative joint disease.   PROCEDURE:  Left total knee replacement using DePuy cemented total knee  system with #4 cemented fibula, #4 cemented tibia, with 12.5-mm  polyethylene (rotating-platform) RP tibial spacer and 38-mm polyethylene  cemented patella.   SURGEON:  Elana Alm. Thurston Hole, M.D.   ASSISTANT:  Julien Girt, P.A.   ANESTHESIA:  General.   OPERATIVE TIME:  1 hour 30 minutes.   DESCRIPTION OF PROCEDURE:  Miss Rosol was brought into the operating room  on 02/04/07, after a femoral nerve block was placed in the holding room  by Anesthesia.  She was placed on the operating table into supine  position.  She received Ancef 2 g IV preoperatively for prophylaxis.  After being placed under general anesthesia, she had a Foley catheter  placed under sterile conditions.  Her left knee was examined.  Range of  motion was from -10 to 120 degrees with moderate varus deformity; knee  stable to ligamentous exam, with normal patellar tracking.  The left leg  was prepped using sterile DuraPrep and draped using sterile technique.  The leg was exsanguinated and a thigh tourniquet elevated to 365 mm.  Initially, through a 15-cm longitudinal incision based over the patella,  initial exposure was made.  The underlying subcutaneous tissues were  incised along the skin incision.  A median arthrotomy was performed,  revealing an excessive amount of normal-appearing joint fluid.  The  articular surfaces were inspected.  She had grade 3 and 4  changes  medially, grade 4 changes laterally and grade 3 and 4 changes in the  patellofemoral joint.  Osteophytes were removed from the femoral  condyles and tibial plateau.  The medial and lateral meniscal remnants  were removed, as well as the anterior cruciate ligament.  An  intramedullary drill was then drilled up the femoral canal for placement  of the distal femoral cutting jig, which was placed in the appropriate  amount of rotation, and a distal 11-mm cut was made.  The distal femur  was then sized.  A #4 was found to be the appropriate size.  A #4  cutting jig was placed and then these cuts were made in the appropriate  amount of external rotation.  The proximal tibia was then exposed.  The  tibial spines were removed with an oscillating saw.  The intramedullary  drill was drilled down the tibial canal for placement of the proximal  tibial cutting jig, which was placed in the appropriate amount of  rotation, and a proximal 6-mm cut was made based off the medial lower  side.  Spacer blocks were then placed in flexion  and extension; 12.5-mm  blocks were placed.  This gave excellent balancing, excellent stability  and excellent correction of her flexion and varus deformities.  The  proximal tibia was then exposed.  A #4 tibial baseplate trial was placed  on the cut tibial surface.  This gave an excellent fit, and then a keel  cut was made.  The PCL box cutter was then placed on the distal femur,  and then these cuts were made.  At this point, a #4 femoral trial was  placed, and with the #4 tibial baseplate trial and a 12.5-mm  polyethylene RP tibial spacer, and the knee was reduced and taken  through a range of motion from 0 to 125 degrees, with excellent  stability and excellent correction of her flexion and varus deformities.  The patellar cut was then made.  A resurfacing 10-mm cut was made and 3  locking holes placed for a 38-mm patella.  The patellar trial was  placed.  There  was moderate lateral patellar tightness; and, thus a  lateral retinacular release was carried out, decompressing the  patellofemoral joint and improving patellar tracking to normal.  At this  point, it was felt that all the components were of excellent size, fit  and stability.  They were then removed.  The knee was then jet lavaged  and irrigated with 3 L of saline.  The proximal tibia was then exposed,  and a #4 tibial baseplate with cement backing was hammered in position  with an excellent fit, with excess cement being removed from around the  edges.  A #4 femoral component with cement backing was hammered into  position, also with an excellent fit, with excess cement being removed  from around the edges.  The 12.5-mm polyethylene tibial spacer was  placed on the tibial baseplate and the knee reduced and taken through a  range of motion from 0 to 125 degrees with excellent stability and  external correction of her flexion and varus deformities.  A 38-mm  polyethylene cement-backed patella was then placed in its position and  held these with a clamp.  After the cement hardened, patellofemoral  tracking was again evaluated and found to be normal. At this point, it  was felt that all components were of excellent size, fit and stability.  The wound was further irrigated.  The tourniquet was released.  Hemostasis was obtained with cautery.  The arthrotomy was then closed  with #1 Ethibond suture over 2 medium Hemovac drains.  The subcutaneous  tissues were closed with 0 and 2-0 Vicryl, the subcuticular layer closed  with 4-0 Monocryl.  Sterile dressings and a long-leg splint were  applied.  The patient was then awakened, extubated and taken to the  recovery room in stable condition.  Needle and sponge counts were  correct x2 at the end of the case.      Robert A. Thurston Hole, M.D.  Electronically Signed     RAW/MEDQ  D:  02/04/2007  T:  02/04/2007  Job:  [id number]

## 2011-02-06 ENCOUNTER — Encounter: Payer: Self-pay | Admitting: Physical Therapy

## 2011-02-09 ENCOUNTER — Ambulatory Visit: Payer: Worker's Compensation | Admitting: Physical Therapy

## 2011-02-10 ENCOUNTER — Other Ambulatory Visit: Payer: Self-pay | Admitting: Podiatry

## 2011-02-15 ENCOUNTER — Ambulatory Visit: Payer: Worker's Compensation | Admitting: Physical Therapy

## 2011-02-16 ENCOUNTER — Encounter: Payer: Self-pay | Admitting: Physical Therapy

## 2011-02-20 ENCOUNTER — Ambulatory Visit: Payer: Worker's Compensation | Attending: Orthopedic Surgery | Admitting: Physical Therapy

## 2011-02-20 DIAGNOSIS — M6281 Muscle weakness (generalized): Secondary | ICD-10-CM | POA: Insufficient documentation

## 2011-02-20 DIAGNOSIS — M25619 Stiffness of unspecified shoulder, not elsewhere classified: Secondary | ICD-10-CM | POA: Insufficient documentation

## 2011-02-20 DIAGNOSIS — IMO0001 Reserved for inherently not codable concepts without codable children: Secondary | ICD-10-CM | POA: Insufficient documentation

## 2011-02-20 DIAGNOSIS — M25519 Pain in unspecified shoulder: Secondary | ICD-10-CM | POA: Insufficient documentation

## 2011-02-23 ENCOUNTER — Ambulatory Visit: Payer: Worker's Compensation | Admitting: Physical Therapy

## 2011-03-15 ENCOUNTER — Ambulatory Visit: Payer: Worker's Compensation | Admitting: Physical Therapy

## 2011-03-21 ENCOUNTER — Ambulatory Visit: Payer: Worker's Compensation | Attending: Orthopedic Surgery | Admitting: Physical Therapy

## 2011-03-21 DIAGNOSIS — M25519 Pain in unspecified shoulder: Secondary | ICD-10-CM | POA: Insufficient documentation

## 2011-03-21 DIAGNOSIS — IMO0001 Reserved for inherently not codable concepts without codable children: Secondary | ICD-10-CM | POA: Insufficient documentation

## 2011-03-21 DIAGNOSIS — M25619 Stiffness of unspecified shoulder, not elsewhere classified: Secondary | ICD-10-CM | POA: Insufficient documentation

## 2011-03-21 DIAGNOSIS — M6281 Muscle weakness (generalized): Secondary | ICD-10-CM | POA: Insufficient documentation

## 2011-03-28 ENCOUNTER — Encounter: Payer: Self-pay | Admitting: Physical Therapy

## 2011-07-03 LAB — BASIC METABOLIC PANEL
GFR calc Af Amer: >60
Glucose, Bld: 130 — ABNORMAL HIGH
Potassium: 4.2
Sodium: 141

## 2012-03-07 ENCOUNTER — Other Ambulatory Visit: Payer: Self-pay | Admitting: Neurosurgery

## 2012-03-19 ENCOUNTER — Encounter (HOSPITAL_COMMUNITY)
Admission: RE | Admit: 2012-03-19 | Discharge: 2012-03-19 | Disposition: A | Discharge status: Home / Self Care | Payer: Worker's Compensation | Source: Ambulatory Visit | Attending: Neurosurgery | Admitting: Neurosurgery

## 2012-03-19 ENCOUNTER — Encounter (HOSPITAL_COMMUNITY): Payer: Self-pay

## 2012-03-19 HISTORY — DX: Fibromyalgia: M79.7

## 2012-03-19 HISTORY — DX: Sleep apnea, unspecified: G47.30

## 2012-03-19 HISTORY — DX: Other specified postprocedural states: Z98.890

## 2012-03-19 HISTORY — DX: Headache: R51

## 2012-03-19 HISTORY — DX: Anxiety disorder, unspecified: F41.9

## 2012-03-19 HISTORY — DX: Essential (primary) hypertension: I10

## 2012-03-19 HISTORY — DX: Unspecified osteoarthritis, unspecified site: M19.90

## 2012-03-19 HISTORY — DX: Other specified postprocedural states: R11.2

## 2012-03-19 LAB — CBC
HCT: 46.2 % — ABNORMAL HIGH (ref 36.0–46.0)
Platelets: 289 10*3/uL (ref 150–400)
RBC: 5.05 MIL/uL (ref 3.87–5.11)
RDW: 13.6 % (ref 11.5–15.5)
WBC: 10.6 10*3/uL — ABNORMAL HIGH (ref 4.0–10.5)

## 2012-03-19 LAB — BASIC METABOLIC PANEL
BUN: 8 mg/dL (ref 6–23)
CO2: 25 mEq/L (ref 19–32)
Chloride: 99 mEq/L (ref 96–112)
GFR calc Af Amer: >90 mL/min (ref >90)
Potassium: 4.1 mEq/L (ref 3.5–5.1)

## 2012-03-19 LAB — SURGICAL PCR SCREEN: MRSA, PCR: NEGATIVE

## 2012-03-19 NOTE — Pre-Procedure Instructions (Signed)
20 Denise [name]  03/19/2012   Your procedure is scheduled on:  03/26/12  Report to [facility] at 930 AM.  Call this number if you have problems the morning of surgery: [phone]   Remember:   Do not eat food liquids :After Midnight.     Take these medicines the morning of surgery with A SIP OF WATER: norvasc,celexa,claritin   Do not wear jewelry, make-up or nail polish.  Do not wear lotions, powders, or perfumes. You may wear deodorant.  Do not shave 48 hours prior to surgery. Men may shave face and neck.  Do not bring valuables to the hospital.  Contacts, dentures or bridgework may not be worn into surgery.  Leave suitcase in the car. After surgery it may be brought to your room.  For patients admitted to the hospital, checkout time is 11:00 AM the day of discharge.   Patients discharged the day of surgery will not be allowed to drive home.  Name and phone number of your driver: husband  Special Instructions: CHG Shower Use Special Wash: 1/2 bottle night before surgery and 1/2 bottle morning of surgery.   Please read over the following fact sheets that you were given: Pain Booklet, Coughing and Deep Breathing, MRSA Information and Surgical Site Infection Prevention

## 2012-03-19 NOTE — Progress Notes (Signed)
Dr Zachery Dauer called for current ekg. Last sleep study   >5 yrs ago

## 2012-03-19 NOTE — Progress Notes (Signed)
08 sleep study in epic

## 2012-03-25 MED ORDER — DEXTROSE 5 % IV SOLN
3.0000 g | INTRAVENOUS | Status: AC
  Administered 2012-03-26: 3 g via INTRAVENOUS
  Filled 2012-03-25: qty 30
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
## 2012-03-26 ENCOUNTER — Encounter (HOSPITAL_COMMUNITY): Payer: Self-pay | Admitting: Anesthesiology

## 2012-03-26 ENCOUNTER — Ambulatory Visit (HOSPITAL_COMMUNITY): Payer: Worker's Compensation

## 2012-03-26 ENCOUNTER — Ambulatory Visit (HOSPITAL_COMMUNITY): Payer: Worker's Compensation | Admitting: Anesthesiology

## 2012-03-26 ENCOUNTER — Encounter (HOSPITAL_COMMUNITY)
Admission: RE | Disposition: A | Discharge status: Home / Self Care | Payer: Self-pay | Source: Ambulatory Visit | Attending: Neurosurgery

## 2012-03-26 ENCOUNTER — Ambulatory Visit (HOSPITAL_COMMUNITY)
Admission: RE | Admit: 2012-03-26 | Discharge: 2012-03-27 | Disposition: A | Discharge status: Home / Self Care | Payer: Worker's Compensation | Source: Ambulatory Visit | Attending: Neurosurgery | Admitting: Neurosurgery

## 2012-03-26 DIAGNOSIS — M47812 Spondylosis without myelopathy or radiculopathy, cervical region: Secondary | ICD-10-CM | POA: Insufficient documentation

## 2012-03-26 DIAGNOSIS — G4730 Sleep apnea, unspecified: Secondary | ICD-10-CM | POA: Insufficient documentation

## 2012-03-26 DIAGNOSIS — M5020 Other cervical disc displacement, unspecified cervical region: Secondary | ICD-10-CM | POA: Insufficient documentation

## 2012-03-26 DIAGNOSIS — Z01818 Encounter for other preprocedural examination: Secondary | ICD-10-CM | POA: Insufficient documentation

## 2012-03-26 DIAGNOSIS — E119 Type 2 diabetes mellitus without complications: Secondary | ICD-10-CM | POA: Insufficient documentation

## 2012-03-26 DIAGNOSIS — Z01812 Encounter for preprocedural laboratory examination: Secondary | ICD-10-CM | POA: Insufficient documentation

## 2012-03-26 DIAGNOSIS — R443 Hallucinations, unspecified: Secondary | ICD-10-CM | POA: Insufficient documentation

## 2012-03-26 DIAGNOSIS — I10 Essential (primary) hypertension: Secondary | ICD-10-CM | POA: Insufficient documentation

## 2012-03-26 DIAGNOSIS — K219 Gastro-esophageal reflux disease without esophagitis: Secondary | ICD-10-CM | POA: Insufficient documentation

## 2012-03-26 HISTORY — PX: ANTERIOR CERVICAL DECOMP/DISCECTOMY FUSION: SHX1161

## 2012-03-26 LAB — GLUCOSE, CAPILLARY: Glucose-Capillary: 170 mg/dL — ABNORMAL HIGH (ref 70–99)

## 2012-03-26 SURGERY — ANTERIOR CERVICAL DECOMPRESSION/DISCECTOMY FUSION 2 LEVELS
Anesthesia: General | Site: Neck | Wound class: Clean

## 2012-03-26 MED ORDER — ACETAMINOPHEN 650 MG RE SUPP: 650.0000 mg | RECTAL | Status: DC | PRN

## 2012-03-26 MED ORDER — ACETAMINOPHEN 10 MG/ML IV SOLN
INTRAVENOUS | Status: DC | PRN
  Administered 2012-03-26: 1000 mg via INTRAVENOUS

## 2012-03-26 MED ORDER — MORPHINE SULFATE 2 MG/ML IJ SOLN: 1.0000 mg | INTRAMUSCULAR | Status: DC | PRN

## 2012-03-26 MED ORDER — QUINAPRIL HCL 10 MG PO TABS: 20.0000 mg | ORAL_TABLET | Freq: Every day | ORAL | Status: DC

## 2012-03-26 MED ORDER — PROPOFOL 10 MG/ML IV EMUL
INTRAVENOUS | Status: DC | PRN
  Administered 2012-03-26: 150 mg via INTRAVENOUS

## 2012-03-26 MED ORDER — ONDANSETRON HCL 4 MG/2ML IJ SOLN: 4.0000 mg | INTRAMUSCULAR | Status: DC | PRN

## 2012-03-26 MED ORDER — METFORMIN HCL 500 MG PO TABS
1000.0000 mg | ORAL_TABLET | Freq: Two times a day (BID) | ORAL | Status: DC
  Administered 2012-03-26 – 2012-03-27 (×2): 1000 mg via ORAL
  Filled 2012-03-26 (×4): qty 2

## 2012-03-26 MED ORDER — NEOSTIGMINE METHYLSULFATE 1 MG/ML IJ SOLN
INTRAMUSCULAR | Status: DC | PRN
  Administered 2012-03-26: 4 mg via INTRAVENOUS

## 2012-03-26 MED ORDER — DEXTROSE 5 % IV SOLN
INTRAVENOUS | Status: DC | PRN
  Administered 2012-03-26 (×2): via INTRAVENOUS

## 2012-03-26 MED ORDER — MENTHOL 3 MG MT LOZG: 1.0000 | LOZENGE | OROMUCOSAL | Status: DC | PRN

## 2012-03-26 MED ORDER — INSULIN ASPART 100 UNIT/ML ~~LOC~~ SOLN
SUBCUTANEOUS | Status: AC
  Administered 2012-03-26: 4 [IU] via SUBCUTANEOUS
  Filled 2012-03-26: qty 1

## 2012-03-26 MED ORDER — INSULIN ASPART 100 UNIT/ML ~~LOC~~ SOLN
4.0000 [IU] | Freq: Three times a day (TID) | SUBCUTANEOUS | Status: DC
  Administered 2012-03-27: 4 [IU] via SUBCUTANEOUS

## 2012-03-26 MED ORDER — DEXAMETHASONE SODIUM PHOSPHATE 10 MG/ML IJ SOLN
INTRAMUSCULAR | Status: DC | PRN
  Administered 2012-03-26: 10 mg via INTRAVENOUS

## 2012-03-26 MED ORDER — HYDROCODONE-ACETAMINOPHEN 5-325 MG PO TABS
1.0000 | ORAL_TABLET | ORAL | Status: DC | PRN
  Administered 2012-03-26 – 2012-03-27 (×3): 2 via ORAL
  Filled 2012-03-26 (×3): qty 2

## 2012-03-26 MED ORDER — ONDANSETRON HCL 4 MG/2ML IJ SOLN
INTRAMUSCULAR | Status: DC | PRN
  Administered 2012-03-26: 4 mg via INTRAVENOUS

## 2012-03-26 MED ORDER — BACITRACIN 50000 UNITS IM SOLR
INTRAMUSCULAR | Status: AC
  Filled 2012-03-26: qty 1

## 2012-03-26 MED ORDER — LISINOPRIL 20 MG PO TABS
20.0000 mg | ORAL_TABLET | Freq: Every day | ORAL | Status: DC
  Administered 2012-03-26: 20 mg via ORAL
  Filled 2012-03-26 (×2): qty 1

## 2012-03-26 MED ORDER — LACTATED RINGERS IV SOLN
INTRAVENOUS | Status: DC | PRN
  Administered 2012-03-26 (×2): via INTRAVENOUS

## 2012-03-26 MED ORDER — AMLODIPINE BESYLATE 5 MG PO TABS
5.0000 mg | ORAL_TABLET | Freq: Every day | ORAL | Status: DC
  Filled 2012-03-26: qty 1

## 2012-03-26 MED ORDER — OXYCODONE-ACETAMINOPHEN 5-325 MG PO TABS: 1.0000 | ORAL_TABLET | ORAL | Status: DC | PRN

## 2012-03-26 MED ORDER — MIDAZOLAM HCL 5 MG/5ML IJ SOLN
INTRAMUSCULAR | Status: DC | PRN
  Administered 2012-03-26: 2 mg via INTRAVENOUS

## 2012-03-26 MED ORDER — ACETAMINOPHEN 325 MG PO TABS: 650.0000 mg | ORAL_TABLET | ORAL | Status: DC | PRN

## 2012-03-26 MED ORDER — CITALOPRAM HYDROBROMIDE 20 MG PO TABS
20.0000 mg | ORAL_TABLET | Freq: Every day | ORAL | Status: DC
Start: 2012-03-27 — End: 2012-03-27
  Filled 2012-03-26: qty 1

## 2012-03-26 MED ORDER — HYDROMORPHONE HCL PF 1 MG/ML IJ SOLN: 0.2500 mg | INTRAMUSCULAR | Status: DC | PRN

## 2012-03-26 MED ORDER — LORATADINE 10 MG PO TABS
10.0000 mg | ORAL_TABLET | Freq: Every day | ORAL | Status: DC | PRN
  Filled 2012-03-26: qty 1

## 2012-03-26 MED ORDER — THROMBIN 5000 UNITS EX KIT
PACK | CUTANEOUS | Status: DC | PRN
  Administered 2012-03-26 (×2): 5000 [IU] via TOPICAL

## 2012-03-26 MED ORDER — KCL IN DEXTROSE-NACL 20-5-0.45 MEQ/L-%-% IV SOLN
INTRAVENOUS | Status: DC
  Administered 2012-03-26: 18:00:00 via INTRAVENOUS
  Filled 2012-03-26 (×3): qty 1000

## 2012-03-26 MED ORDER — HEMOSTATIC AGENTS (NO CHARGE) OPTIME
TOPICAL | Status: DC | PRN
  Administered 2012-03-26: 1 via TOPICAL

## 2012-03-26 MED ORDER — ROCURONIUM BROMIDE 100 MG/10ML IV SOLN
INTRAVENOUS | Status: DC | PRN
  Administered 2012-03-26: 60 mg via INTRAVENOUS
  Administered 2012-03-26: 20 mg via INTRAVENOUS
  Administered 2012-03-26 (×2): 10 mg via INTRAVENOUS

## 2012-03-26 MED ORDER — SODIUM CHLORIDE 0.9 % IJ SOLN: 3.0000 mL | INTRAMUSCULAR | Status: DC | PRN

## 2012-03-26 MED ORDER — ZOLPIDEM TARTRATE 5 MG PO TABS: 5.0000 mg | ORAL_TABLET | Freq: Every evening | ORAL | Status: DC | PRN

## 2012-03-26 MED ORDER — GLYCOPYRROLATE 0.2 MG/ML IJ SOLN
INTRAMUSCULAR | Status: DC | PRN
  Administered 2012-03-26: 0.6 mg via INTRAVENOUS

## 2012-03-26 MED ORDER — LIDOCAINE HCL (CARDIAC) 20 MG/ML IV SOLN
INTRAVENOUS | Status: DC | PRN
  Administered 2012-03-26: 80 mg via INTRAVENOUS

## 2012-03-26 MED ORDER — LIDOCAINE-EPINEPHRINE 1 %-1:100000 IJ SOLN
INTRAMUSCULAR | Status: DC | PRN
  Administered 2012-03-26: 20 mL

## 2012-03-26 MED ORDER — SODIUM CHLORIDE 0.9 % IV SOLN
INTRAVENOUS | Status: AC
  Filled 2012-03-26: qty 500

## 2012-03-26 MED ORDER — BUPIVACAINE HCL (PF) 0.5 % IJ SOLN
INTRAMUSCULAR | Status: DC | PRN
  Administered 2012-03-26: 30 mL

## 2012-03-26 MED ORDER — DROPERIDOL 2.5 MG/ML IJ SOLN
INTRAMUSCULAR | Status: DC | PRN
  Administered 2012-03-26: 1.25 mg via INTRAVENOUS

## 2012-03-26 MED ORDER — SODIUM CHLORIDE 0.9 % IJ SOLN: 3.0000 mL | Freq: Two times a day (BID) | INTRAMUSCULAR | Status: DC

## 2012-03-26 MED ORDER — INSULIN ASPART 100 UNIT/ML ~~LOC~~ SOLN
0.0000 [IU] | Freq: Every day | SUBCUTANEOUS | Status: DC
  Administered 2012-03-26: 2 [IU] via SUBCUTANEOUS

## 2012-03-26 MED ORDER — ACETAMINOPHEN 10 MG/ML IV SOLN
INTRAVENOUS | Status: AC
  Filled 2012-03-26: qty 100

## 2012-03-26 MED ORDER — KETOROLAC TROMETHAMINE 30 MG/ML IJ SOLN
INTRAMUSCULAR | Status: AC
  Administered 2012-03-26: 30 mg
  Filled 2012-03-26: qty 1

## 2012-03-26 MED ORDER — PHENYLEPHRINE HCL 10 MG/ML IJ SOLN
10.0000 mg | INTRAVENOUS | Status: DC | PRN
  Administered 2012-03-26 (×2): 25 ug/min via INTRAVENOUS

## 2012-03-26 MED ORDER — SODIUM CHLORIDE 0.9 % IR SOLN
Status: DC | PRN
  Administered 2012-03-26: 13:00:00

## 2012-03-26 MED ORDER — ADULT MULTIVITAMIN W/MINERALS CH
1.0000 | ORAL_TABLET | Freq: Every day | ORAL | Status: DC
  Administered 2012-03-26: 1 via ORAL
  Filled 2012-03-26 (×2): qty 1

## 2012-03-26 MED ORDER — FENTANYL CITRATE 0.05 MG/ML IJ SOLN
INTRAMUSCULAR | Status: DC | PRN
  Administered 2012-03-26: 150 ug via INTRAVENOUS
  Administered 2012-03-26: 50 ug via INTRAVENOUS

## 2012-03-26 MED ORDER — DOCUSATE SODIUM 100 MG PO CAPS
100.0000 mg | ORAL_CAPSULE | Freq: Two times a day (BID) | ORAL | Status: DC
  Administered 2012-03-26 – 2012-03-27 (×2): 100 mg via ORAL
  Filled 2012-03-26 (×2): qty 1

## 2012-03-26 MED ORDER — PHENOL 1.4 % MT LIQD
1.0000 | OROMUCOSAL | Status: DC | PRN
  Administered 2012-03-27: 1 via OROMUCOSAL
  Filled 2012-03-26: qty 177

## 2012-03-26 MED ORDER — PANTOPRAZOLE SODIUM 40 MG IV SOLR
40.0000 mg | Freq: Every day | INTRAVENOUS | Status: DC
  Administered 2012-03-26: 40 mg via INTRAVENOUS
  Filled 2012-03-26 (×2): qty 40

## 2012-03-26 MED ORDER — INSULIN ASPART 100 UNIT/ML ~~LOC~~ SOLN
0.0000 [IU] | Freq: Three times a day (TID) | SUBCUTANEOUS | Status: DC
  Administered 2012-03-26: 3 [IU] via SUBCUTANEOUS
  Administered 2012-03-27: 2 [IU] via SUBCUTANEOUS

## 2012-03-26 SURGICAL SUPPLY — 82 items
ADH SKN CLS APL DERMABOND .7 (GAUZE/BANDAGES/DRESSINGS) ×1
APL SKNCLS STERI-STRIP NONHPOA (GAUZE/BANDAGES/DRESSINGS)
BAG DECANTER FOR FLEXI CONT (MISCELLANEOUS) ×2 IMPLANT
BANDAGE GAUZE ELAST BULKY 4 IN (GAUZE/BANDAGES/DRESSINGS) IMPLANT
BENZOIN TINCTURE PRP APPL 2/3 (GAUZE/BANDAGES/DRESSINGS) IMPLANT
BIT DRILL 2.3 12 FIXED (INSTRUMENTS) IMPLANT
BIT DRILL NEURO 2X3.1 SFT TUCH (MISCELLANEOUS) ×1 IMPLANT
BLADE ULTRA TIP 2M (BLADE) IMPLANT
BLOCK PROFUSE SZ 6 (Bone Implant) ×1 IMPLANT
BUR BARREL STRAIGHT FLUTE 4.0 (BURR) ×2 IMPLANT
CAGE CERVICAL 7 (Cage) ×2 IMPLANT
CAGE CERVICAL 8 (Cage) ×2 IMPLANT
CANISTER SUCTION 2500CC (MISCELLANEOUS) ×2 IMPLANT
CLOTH BEACON ORANGE TIMEOUT ST (SAFETY) ×2 IMPLANT
CONT SPEC 4OZ CLIKSEAL STRL BL (MISCELLANEOUS) ×2 IMPLANT
COVER MAYO STAND STRL (DRAPES) ×2 IMPLANT
DERMABOND ADVANCED (GAUZE/BANDAGES/DRESSINGS) ×1
DERMABOND ADVANCED .7 DNX12 (GAUZE/BANDAGES/DRESSINGS) ×1 IMPLANT
DRAPE LAPAROTOMY 100X72 PEDS (DRAPES) ×2 IMPLANT
DRAPE MICROSCOPE LEICA (MISCELLANEOUS) IMPLANT
DRAPE POUCH INSTRU U-SHP 10X18 (DRAPES) ×2 IMPLANT
DRAPE PROXIMA HALF (DRAPES) IMPLANT
DRESSING TELFA 8X3 (GAUZE/BANDAGES/DRESSINGS) IMPLANT
DRILL 12MM (INSTRUMENTS) ×2
DRILL NEURO 2X3.1 SOFT TOUCH (MISCELLANEOUS) ×2
DURAPREP 6ML APPLICATOR 50/CS (WOUND CARE) ×2 IMPLANT
ELECT COATED BLADE 2.86 ST (ELECTRODE) ×2 IMPLANT
ELECT REM PT RETURN 9FT ADLT (ELECTROSURGICAL) ×2
ELECTRODE REM PT RTRN 9FT ADLT (ELECTROSURGICAL) ×1 IMPLANT
GAUZE SPONGE 4X4 16PLY XRAY LF (GAUZE/BANDAGES/DRESSINGS) IMPLANT
GLOVE BIO SURGEON STRL SZ8 (GLOVE) ×2 IMPLANT
GLOVE BIOGEL PI IND STRL 7.0 (GLOVE) IMPLANT
GLOVE BIOGEL PI IND STRL 8 (GLOVE) ×1 IMPLANT
GLOVE BIOGEL PI IND STRL 8.5 (GLOVE) ×1 IMPLANT
GLOVE BIOGEL PI INDICATOR 7.0 (GLOVE) ×2
GLOVE BIOGEL PI INDICATOR 8 (GLOVE) ×1
GLOVE BIOGEL PI INDICATOR 8.5 (GLOVE) ×2
GLOVE ECLIPSE 7.5 STRL STRAW (GLOVE) ×4 IMPLANT
GLOVE ECLIPSE 8.0 STRL XLNG CF (GLOVE) ×2 IMPLANT
GLOVE ECLIPSE 8.5 STRL (GLOVE) ×1 IMPLANT
GLOVE EXAM NITRILE LRG STRL (GLOVE) IMPLANT
GLOVE EXAM NITRILE MD LF STRL (GLOVE) IMPLANT
GLOVE EXAM NITRILE XL STR (GLOVE) IMPLANT
GLOVE EXAM NITRILE XS STR PU (GLOVE) IMPLANT
GLOVE INDICATOR 7.5 STRL GRN (GLOVE) ×2 IMPLANT
GLOVE SS BIOGEL STRL SZ 6.5 (GLOVE) IMPLANT
GLOVE SUPERSENSE BIOGEL SZ 6.5 (GLOVE) ×7
GOWN BRE IMP SLV AUR LG STRL (GOWN DISPOSABLE) ×1 IMPLANT
GOWN BRE IMP SLV AUR XL STRL (GOWN DISPOSABLE) ×4 IMPLANT
GOWN STRL REIN 2XL LVL4 (GOWN DISPOSABLE) ×3 IMPLANT
HEAD HALTER (SOFTGOODS) ×2 IMPLANT
KIT BASIN OR (CUSTOM PROCEDURE TRAY) ×2 IMPLANT
KIT ROOM TURNOVER OR (KITS) ×2 IMPLANT
NDL HYPO 18GX1.5 BLUNT FILL (NEEDLE) ×1 IMPLANT
NDL HYPO 25X1 1.5 SAFETY (NEEDLE) ×1 IMPLANT
NDL SPNL 22GX3.5 QUINCKE BK (NEEDLE) ×1 IMPLANT
NEEDLE HYPO 18GX1.5 BLUNT FILL (NEEDLE) ×2 IMPLANT
NEEDLE HYPO 25X1 1.5 SAFETY (NEEDLE) ×2 IMPLANT
NEEDLE SPNL 22GX3.5 QUINCKE BK (NEEDLE) ×2 IMPLANT
NS IRRIG 1000ML POUR BTL (IV SOLUTION) ×2 IMPLANT
PACK LAMINECTOMY NEURO (CUSTOM PROCEDURE TRAY) ×2 IMPLANT
PAD ARMBOARD 7.5X6 YLW CONV (MISCELLANEOUS) ×2 IMPLANT
PIN DISTRACTION 14MM (PIN) ×4 IMPLANT
PLATE 28MM (Plate) ×1 IMPLANT
RUBBERBAND STERILE (MISCELLANEOUS) IMPLANT
SCREW 12MM (Screw) ×6 IMPLANT
SPACER SPNL 7D LRG 16X14X7XNS (Cage) IMPLANT
SPACER SPNL 7D LRG 16X14X8XNS (Cage) IMPLANT
SPCR SPNL 7D LRG 16X14X7XNS (Cage) ×1 IMPLANT
SPCR SPNL 7D LRG 16X14X8XNS (Cage) ×1 IMPLANT
SPONGE GAUZE 4X4 12PLY (GAUZE/BANDAGES/DRESSINGS) IMPLANT
SPONGE INTESTINAL PEANUT (DISPOSABLE) ×2 IMPLANT
SPONGE SURGIFOAM ABS GEL SZ50 (HEMOSTASIS) IMPLANT
STAPLER SKIN PROX WIDE 3.9 (STAPLE) IMPLANT
STRIP CLOSURE SKIN 1/2X4 (GAUZE/BANDAGES/DRESSINGS) IMPLANT
SUT VIC AB 3-0 SH 8-18 (SUTURE) ×2 IMPLANT
SYR 20ML ECCENTRIC (SYRINGE) ×2 IMPLANT
SYR 3ML LL SCALE MARK (SYRINGE) ×2 IMPLANT
TOWEL OR 17X24 6PK STRL BLUE (TOWEL DISPOSABLE) ×2 IMPLANT
TOWEL OR 17X26 10 PK STRL BLUE (TOWEL DISPOSABLE) ×2 IMPLANT
TRAP SPECIMEN MUCOUS 40CC (MISCELLANEOUS) IMPLANT
WATER STERILE IRR 1000ML POUR (IV SOLUTION) ×2 IMPLANT

## 2012-03-26 NOTE — Progress Notes (Signed)
pts bs=[id number] dr fitzgerald notifed,orders obtained and carried out

## 2012-03-26 NOTE — Interval H&P Note (Signed)
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 After consideration of risks, benefits and other options for treatment, the patient has consented to  Procedure(s) (LRB): ANTERIOR CERVICAL DECOMPRESSION/DISCECTOMY FUSION 2 LEVELS (N/A) as a surgical intervention .  The patient's history has been reviewed, patient examined, no change in status, stable for surgery.  I have reviewed the patients' chart and labs.  Questions were answered to the patient's satisfaction.     Graves Nipp D  Date of Initial H&P: 03/25/2012  History reviewed, patient examined, no change in status, stable for surgery.

## 2012-03-26 NOTE — Plan of Care (Signed)
Problem: Consults Goal: Diagnosis - Spinal Surgery Outcome: Completed/Met Date Met:  03/26/12 Cervical Spine Fusion

## 2012-03-26 NOTE — Anesthesia Preprocedure Evaluation (Addendum)
Anesthesia Evaluation  Patient identified by MRN, date of birth, ID band Patient awake    Reviewed: Allergy & Precautions, H&P , NPO status , Patient's Chart, lab work & pertinent test results  History of Anesthesia Complications (+) PONV and AWARENESS UNDER ANESTHESIA  Airway Mallampati: II TM Distance: >3 FB Neck ROM: Full    Dental No notable dental hx. (+) Dental Advisory Given and Teeth Intact   Pulmonary sleep apnea and Continuous Positive Airway Pressure Ventilation ,  Pt unsure of C-Pap settings breath sounds clear to auscultation  Pulmonary exam normal       Cardiovascular hypertension, Pt. on medications Rhythm:Regular Rate:Normal     Neuro/Psych  Headaches, Anxiety Depression Right arm weakness.  Neuromuscular disease negative psych ROS   GI/Hepatic negative GI ROS, Neg liver ROS, GERD-  Controlled and Medicated,  Endo/Other  Type 2, Oral Hypoglycemic AgentsMorbid obesityGlucose 170 this am  Renal/GU negative Renal ROS  negative genitourinary   Musculoskeletal  (+) Fibromyalgia -, narcotic dependent  Abdominal   Peds  Hematology negative hematology ROS (+)   Anesthesia Other Findings   Reproductive/Obstetrics negative OB ROS                       Anesthesia Physical Anesthesia Plan  ASA: III  Anesthesia Plan: General   Post-op Pain Management:    Induction: Intravenous  Airway Management Planned: Oral ETT  Additional Equipment:   Intra-op Plan:   Post-operative Plan: Extubation in OR  Informed Consent: I have reviewed the patients History and Physical, chart, labs and discussed the procedure including the risks, benefits and alternatives for the proposed anesthesia with the patient or authorized representative who has indicated his/her understanding and acceptance.   Dental advisory given  Plan Discussed with: CRNA  Anesthesia Plan Comments:         Anesthesia  Quick Evaluation

## 2012-03-26 NOTE — Progress Notes (Signed)
Patient ID: Denise [name], female   DOB: [birth year], 61 y.o.   MRN: [id number]  Alert, c/o right subclavicular pain & soreness. Good strength BUE. MAEW. Incision with Dermabond - no swelling.  Soft collar repositioned. Reassured re: soreness.  Georgiann Cocker, RN, BSN

## 2012-03-26 NOTE — Op Note (Signed)
03/26/2012  2:21 PM  PATIENT:  Denise [name]  61 y.o. 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, Cervical spondylosis, Cervicalgia, Cervical radiculopathy C 5/6, C 6/7  PROCEDURE:  Procedure(s) (LRB): ANTERIOR CERVICAL DECOMPRESSION/DISCECTOMY FUSION 2 LEVELS with PEEK cages, autograft, allograft, plate (N/A)  SURGEON:  Surgeon(s) and Role:    * Maeola Harman, MD - Primary    * Temple Pacini, MD - Assisting  PHYSICIAN ASSISTANT:   ASSISTANTS: Poteat, RN   ANESTHESIA:   general  EBL:  Total I/O In: 1150 [I.V.:1150] Out: 50 [Blood:50]  BLOOD ADMINISTERED:none  DRAINS: none   LOCAL MEDICATIONS USED:  LIDOCAINE   SPECIMEN:  No Specimen  DISPOSITION OF SPECIMEN:  N/A  COUNTS:  YES  TOURNIQUET:  * No tourniquets in log *  DICTATION: DICTATION: Patient is morbidly obese 61 year old female with right arm pain and weakness with HNP, spondylosis, radiculopathy C5/6 and C6/7  PROCEDURE: Patient was brought to operating room and following the smooth and uncomplicated induction of general endotracheal anesthesia her head was placed on a horseshoe head holder she was placed in 5 pounds of Holter traction and her anterior neck was prepped and draped in usual sterile fashion. An incision was made on the left side of midline after infiltrating the skin and subcutaneous tissues with local lidocaine. The platysmal layer was incised and subplatysmal dissection was performed exposing the anterior border sternocleidomastoid muscle. Using blunt dissection the carotid sheath was kept lateral and trachea and esophagus kept medial exposing the anterior cervical spine. A bent spinal needle was placed it was felt to be the C 4/5 level and this was confirmed on intraoperative x-ray. Longus coli muscles were taken down from the anterior cervical spine  using electrocautery and key elevator and self-retaining retractor was placed exposing the C5/6 and C6/7 levels. The interspaces were incised and a thorough discectomy was performed. Distraction pins were placed. Initially the C6/7 level was operated. Uncinate spurs and central spondylitic ridges were drilled down with a high-speed drill. The spinal cord dura and both C7 nerve roots were widely decompressed. Hemostasis was assured. After trial sizing an 8 mm peek interbody cage was selected and packed with profuse block and autograft. This was tamped into position and countersunk appropriately. Attention was the paid to the C5/6 level, where similar decompression was performed.  Uncinate spurs and central spondylitic ridges were drilled down with a high-speed drill. This level was highly arthritic with significant degree of spondylosis. The spinal cord dura and both C6 nerve roots were widely decompressed. Hemostasis was assured. After trial sizing a 7 mm peek interbody cage was selected and packed with profuse block and autograft. This was tamped into position and countersunk appropriately.Distraction weight was removed. A 28 mm trestle luxe anterior cervical plate was affixed to the cervical spine with 12 mm variable-angle screws 2 at C5, 2 at C6 and 2 at C7. All screws were well-positioned and locking mechanisms were engaged. A final X ray was obtained which showed well positioned grafts and anterior plate without complicating features at the C 5/6 level. Soft tissues were inspected and found to be in good repair. The wound was irrigated. The platysma layer was closed with 3-0 Vicryl stitches and the skin was reapproximated with 3-0 Vicryl subcuticular stitches. The wound was dressed with Dermabond. Counts were correct at the end of the case. Patient was extubated  and taken to recovery in stable and satisfactory condition.   PLAN OF CARE: Admit for overnight observation  PATIENT DISPOSITION:  PACU -  hemodynamically stable.   Delay start of Pharmacological VTE agent (>24hrs) due to surgical blood loss or risk of bleeding: yes

## 2012-03-26 NOTE — Progress Notes (Signed)
Care of pt assumed by MA [facility]  Prior to this care was delivered by S. Marcene Corning RN

## 2012-03-26 NOTE — Progress Notes (Signed)
Report given to Aliceson Dolbow rn as caregiver 

## 2012-03-26 NOTE — Preoperative (Signed)
Beta Blockers   Reason not to administer Beta Blockers:Not Applicable 

## 2012-03-26 NOTE — Transfer of Care (Signed)
Immediate Anesthesia Transfer of Care Note  Patient: Denise [name]  Procedure(s) Performed: Procedure(s) (LRB): ANTERIOR CERVICAL DECOMPRESSION/DISCECTOMY FUSION 2 LEVELS (N/A)  Patient Location: PACU  Anesthesia Type: General  Level of Consciousness: awake, sedated and patient cooperative  Airway & Oxygen Therapy: Patient Spontanous Breathing and Patient connected to face mask oxygen  Post-op Assessment: Report given to PACU RN and Post -op Vital signs reviewed and stable  Post vital signs: Reviewed and stable  Complications: No apparent anesthesia complications

## 2012-03-26 NOTE — Progress Notes (Signed)
Dr Fredrich Birks RN here and updated re pt's pain c/o and dose of toradol. No new orders.

## 2012-03-26 NOTE — Progress Notes (Signed)
Patient awake, alert, conversant.  Sleepy but arousable.  Full bilateral biceps/triceps/grip strength.  MAEW.   Doing well s/p ACDF.

## 2012-03-26 NOTE — Anesthesia Postprocedure Evaluation (Signed)
  Anesthesia Post-op Note  Patient: Denise [name]  Procedure(s) Performed: Procedure(s) (LRB): ANTERIOR CERVICAL DECOMPRESSION/DISCECTOMY FUSION 2 LEVELS (N/A)  Patient Location: PACU  Anesthesia Type: General  Level of Consciousness: awake  Airway and Oxygen Therapy: Patient Spontanous Breathing and Patient connected to nasal cannula oxygen  Post-op Pain: mild  Post-op Assessment: Post-op Vital signs reviewed, Patient's Cardiovascular Status Stable, Respiratory Function Stable, Patent Airway and No signs of Nausea or vomiting  Post-op Vital Signs: Reviewed and stable  Complications: No apparent anesthesia complications

## 2012-03-26 NOTE — Progress Notes (Signed)
Agree with above 

## 2012-03-26 NOTE — Progress Notes (Signed)
Pt  Still sleepy but c/o pain at front of shoulders.  o2 sat 90-92 % on 3 l [place], [id number].  Dr Sampson Goon updated. New order for 1x dose iv Toradol obt and given.

## 2012-03-26 NOTE — Anesthesia Procedure Notes (Signed)
Procedure Name: Intubation Date/Time: 03/26/2012 11:30 AM Performed by: Tyrone Nine Pre-anesthesia Checklist: Patient identified, Timeout performed, Emergency Drugs available, Suction available and Patient being monitored Patient Re-evaluated:Patient Re-evaluated prior to inductionOxygen Delivery Method: Circle system utilized Preoxygenation: Pre-oxygenation with 100% oxygen Intubation Type: IV induction Ventilation: Two handed mask ventilation required Laryngoscope Size: Mac and 3 Grade View: Grade II Tube type: Oral Tube size: 7.5 mm Number of attempts: 1 Airway Equipment and Method: Stylet Placement Confirmation: ETT inserted through vocal cords under direct vision,  breath sounds checked- equal and bilateral and positive ETCO2 Secured at: 22 cm Tube secured with: Tape Dental Injury: Teeth and Oropharynx as per pre-operative assessment  Difficulty Due To: Difficulty was anticipated and Difficult Airway- due to reduced neck mobility Future Recommendations: Recommend- induction with short-acting agent, and alternative techniques readily available Comments: Limited neck extension, short neck,  Large breasts which needed retracted.

## 2012-03-27 ENCOUNTER — Encounter (HOSPITAL_COMMUNITY): Payer: Self-pay | Admitting: Neurosurgery

## 2012-03-27 LAB — GLUCOSE, CAPILLARY: Glucose-Capillary: 148 mg/dL — ABNORMAL HIGH (ref 70–99)

## 2012-03-27 MED ORDER — HYDROCODONE-ACETAMINOPHEN 5-325 MG PO TABS: 1.0000 | ORAL_TABLET | ORAL | Status: AC | PRN

## 2012-03-27 NOTE — Discharge Summary (Signed)
Physician Discharge Summary  Patient ID: Denise [name] MRN: [id number] DOB/AGE: 1951-04-16 61 y.o.  Admit date: 03/26/2012 Discharge date: 03/27/2012  Admission Diagnoses:Herniated cervical disc with radiculopathy C5/6 and C6/7  Discharge Diagnoses: Herniated cervical disc with radiculopathy C5/6 and C6/7 Active Problems:  * No active hospital problems. *    Discharged Condition: good  Hospital Course: Uncomplicated ACDF C5/6 and C6/7  Consults: None  Significant Diagnostic Studies: None  Treatments: surgery:  ACDF C5/6 and C6/7  Discharge Exam: Blood pressure 110/61, pulse 82, temperature 97.6 F (36.4 C), temperature source Oral, resp. rate 18, SpO2 93.00%. Neurologic: Alert and oriented X 3, normal strength and tone. Normal symmetric reflexes. Normal coordination and gait Wound:CDI.  Mild hoarseness  Disposition: Home   Medication List  As of 03/27/2012  8:17 AM   STOP taking these medications         naproxen sodium 220 MG tablet         TAKE these medications         ACCUPRIL 20 MG tablet   Generic drug: quinapril   Take 20 mg by mouth at bedtime.      amLODipine 5 MG tablet   Commonly known as: NORVASC   Take 5 mg by mouth daily.      citalopram 20 MG tablet   Commonly known as: CELEXA   Take 20 mg by mouth daily.      HYDROcodone-acetaminophen 5-325 MG per tablet   Commonly known as: NORCO   Take 1-2 tablets by mouth every 4 (four) hours as needed.      loratadine 10 MG tablet   Commonly known as: CLARITIN   Take 10 mg by mouth daily as needed. allergies      metFORMIN 1000 MG tablet   Commonly known as: GLUCOPHAGE   Take 1,000 mg by mouth 2 (two) times daily with a meal.      multivitamin with minerals Tabs   Take 1 tablet by mouth daily.             Signed: Dorian Heckle, MD 03/27/2012, 8:17 AM

## 2012-03-27 NOTE — Progress Notes (Signed)
Subjective: Patient reports pain and weakness improved  Objective: Vital signs in last 24 hours: Temp:  [98.2 F (36.8 C)-99.1 F (37.3 C)] 98.2 F (36.8 C) (07/10 0357) Pulse Rate:  [82-107] 82  (07/10 0357) Resp:  [12-30] 18  (07/10 0357) BP: (84-156)/(39-84) 102/67 mmHg (07/10 0357) SpO2:  [90 %-96 %] 93 % (07/10 0357)  Intake/Output from previous day: 07/09 0701 - 07/10 0700 In: 2450 [P.O.:600; I.V.:1850] Out: 50 [Blood:50] Intake/Output this shift:    Physical Exam: Mild hoarseness.  Strength full bilateral deltoids, biceps, triceps. Dressing CDI  Lab Results: No results found for this basename: WBC:2,HGB:2,HCT:2,PLT:2 in the last 72 hours BMET No results found for this basename: NA:2,K:2,CL:2,CO2:2,GLUCOSE:2,BUN:2,CREATININE:2,CALCIUM:2 in the last 72 hours  Studies/Results: Dg Cervical Spine 2-3 Views  03/26/2012  *RADIOLOGY REPORT*  Clinical Data: Neck pain  CERVICAL SPINE - 2-3 VIEW  Comparison: None.  Findings: Film #1 demonstrates a needle from an anterior approach within the C4-5 interspace.  Film #2 demonstrates anterior cervical discectomy interbody fusion from C5-C7.  The lower aspect of the fusion construct is poorly visualized.  IMPRESSION: As above.  Original Report Authenticated By: Elsie Stain, M.[name].    Assessment/Plan: Doing well.  [name]/C home    LOS: 1 day    Denise Norman D, MD 03/27/2012, 7:05 AM

## 2012-11-25 ENCOUNTER — Observation Stay (HOSPITAL_COMMUNITY)
Admission: EM | Admit: 2012-11-25 | Discharge: 2012-11-26 | Disposition: A | Discharge status: Home / Self Care | Payer: BC Managed Care – PPO | Attending: Family Medicine | Admitting: Family Medicine

## 2012-11-25 ENCOUNTER — Emergency Department (HOSPITAL_COMMUNITY): Payer: BC Managed Care – PPO

## 2012-11-25 ENCOUNTER — Observation Stay (HOSPITAL_COMMUNITY): Payer: BC Managed Care – PPO

## 2012-11-25 ENCOUNTER — Encounter (HOSPITAL_COMMUNITY): Payer: Self-pay | Admitting: *Deleted

## 2012-11-25 DIAGNOSIS — M797 Fibromyalgia: Secondary | ICD-10-CM

## 2012-11-25 DIAGNOSIS — I10 Essential (primary) hypertension: Secondary | ICD-10-CM | POA: Insufficient documentation

## 2012-11-25 DIAGNOSIS — IMO0001 Reserved for inherently not codable concepts without codable children: Secondary | ICD-10-CM | POA: Insufficient documentation

## 2012-11-25 DIAGNOSIS — R000 Tachycardia, unspecified: Secondary | ICD-10-CM | POA: Insufficient documentation

## 2012-11-25 DIAGNOSIS — R079 Chest pain, unspecified: Principal | ICD-10-CM | POA: Insufficient documentation

## 2012-11-25 DIAGNOSIS — E119 Type 2 diabetes mellitus without complications: Secondary | ICD-10-CM

## 2012-11-25 LAB — PROTIME-INR
INR: 0.88 (ref 0.00–1.49)
Prothrombin Time: 11.9 seconds (ref 11.6–15.2)

## 2012-11-25 LAB — CBC
HCT: 42.7 % (ref 36.0–46.0)
Hemoglobin: 14.3 g/dL (ref 12.0–15.0)
MCH: 31.3 pg (ref 26.0–34.0)
MCHC: 34.3 g/dL (ref 30.0–36.0)
MCV: 91.2 fL (ref 78.0–100.0)
Platelets: 311 10*3/uL (ref 150–400)
RBC: 4.64 MIL/uL (ref 3.87–5.11)
RDW: 13.5 % (ref 11.5–15.5)
WBC: 12.4 10*3/uL — ABNORMAL HIGH (ref 4.0–10.5)

## 2012-11-25 LAB — COMPREHENSIVE METABOLIC PANEL
AST: 30 U/L (ref 0–37)
Albumin: 4 g/dL (ref 3.5–5.2)
Calcium: 10.2 mg/dL (ref 8.4–10.5)
Creatinine, Ser: 0.5 mg/dL (ref 0.50–1.10)

## 2012-11-25 LAB — CREATININE, SERUM
GFR calc Af Amer: >90 mL/min (ref >90)
GFR calc non Af Amer: >90 mL/min (ref >90)

## 2012-11-25 LAB — APTT: aPTT: 30 seconds (ref 24–37)

## 2012-11-25 LAB — TROPONIN I: Troponin I: <0.3 ng/mL (ref <0.30)

## 2012-11-25 LAB — POCT I-STAT TROPONIN I

## 2012-11-25 MED ORDER — IOHEXOL 350 MG/ML SOLN
100.0000 mL | Freq: Once | INTRAVENOUS | Status: AC | PRN
  Administered 2012-11-25: 100 mL via INTRAVENOUS

## 2012-11-25 MED ORDER — ONDANSETRON HCL 4 MG PO TABS: 4.0000 mg | ORAL_TABLET | Freq: Four times a day (QID) | ORAL | Status: DC | PRN

## 2012-11-25 MED ORDER — SODIUM CHLORIDE 0.9 % IV SOLN
1000.0000 mL | INTRAVENOUS | Status: DC
  Administered 2012-11-25 (×3): 1000 mL via INTRAVENOUS

## 2012-11-25 MED ORDER — ENOXAPARIN SODIUM 40 MG/0.4ML ~~LOC~~ SOLN
40.0000 mg | SUBCUTANEOUS | Status: DC
  Administered 2012-11-25: 40 mg via SUBCUTANEOUS
  Filled 2012-11-25 (×2): qty 0.4

## 2012-11-25 MED ORDER — NITROGLYCERIN 2 % TD OINT
1.0000 [in_us] | TOPICAL_OINTMENT | Freq: Once | TRANSDERMAL | Status: AC
  Administered 2012-11-25: 1 [in_us] via TOPICAL
  Filled 2012-11-25: qty 1

## 2012-11-25 MED ORDER — QUINAPRIL HCL 10 MG PO TABS
20.0000 mg | ORAL_TABLET | Freq: Every day | ORAL | Status: DC
  Filled 2012-11-25 (×2): qty 2

## 2012-11-25 MED ORDER — INSULIN ASPART 100 UNIT/ML ~~LOC~~ SOLN: 0.0000 [IU] | Freq: Three times a day (TID) | SUBCUTANEOUS | Status: DC

## 2012-11-25 MED ORDER — ACETAMINOPHEN 650 MG RE SUPP: 650.0000 mg | Freq: Four times a day (QID) | RECTAL | Status: DC | PRN

## 2012-11-25 MED ORDER — GUAIFENESIN-CODEINE 100-10 MG/5ML PO SOLN
10.0000 mL | Freq: Four times a day (QID) | ORAL | Status: DC | PRN
  Administered 2012-11-25: 10 mL via ORAL
  Filled 2012-11-25 (×2): qty 5

## 2012-11-25 MED ORDER — SODIUM CHLORIDE 0.9 % IV SOLN: 1000.0000 mL | INTRAVENOUS | Status: DC

## 2012-11-25 MED ORDER — ONDANSETRON HCL 4 MG/2ML IJ SOLN: 4.0000 mg | Freq: Four times a day (QID) | INTRAMUSCULAR | Status: DC | PRN

## 2012-11-25 MED ORDER — ACETAMINOPHEN 325 MG PO TABS: 650.0000 mg | ORAL_TABLET | Freq: Four times a day (QID) | ORAL | Status: DC | PRN

## 2012-11-25 MED ORDER — ASPIRIN 81 MG PO CHEW: 324.0000 mg | CHEWABLE_TABLET | Freq: Once | ORAL | Status: DC

## 2012-11-25 MED ORDER — LEVALBUTEROL HCL 0.63 MG/3ML IN NEBU: 0.6300 mg | INHALATION_SOLUTION | Freq: Four times a day (QID) | RESPIRATORY_TRACT | Status: DC | PRN

## 2012-11-25 MED ORDER — INDOMETHACIN ER 75 MG PO CPCR
75.0000 mg | ORAL_CAPSULE | Freq: Two times a day (BID) | ORAL | Status: DC
  Filled 2012-11-25 (×3): qty 1

## 2012-11-25 MED ORDER — TRAMADOL HCL 50 MG PO TABS
50.0000 mg | ORAL_TABLET | Freq: Once | ORAL | Status: AC
  Administered 2012-11-25: 50 mg via ORAL
  Filled 2012-11-25: qty 1

## 2012-11-25 MED ORDER — AMLODIPINE BESYLATE 5 MG PO TABS
5.0000 mg | ORAL_TABLET | Freq: Every day | ORAL | Status: DC
  Filled 2012-11-25 (×2): qty 1

## 2012-11-25 MED ORDER — METOPROLOL SUCCINATE 12.5 MG HALF TABLET
12.5000 mg | ORAL_TABLET | Freq: Every day | ORAL | Status: DC
  Filled 2012-11-25: qty 1

## 2012-11-25 MED ORDER — CYCLOBENZAPRINE HCL 10 MG PO TABS
10.0000 mg | ORAL_TABLET | Freq: Three times a day (TID) | ORAL | Status: DC | PRN
  Administered 2012-11-25: 10 mg via ORAL
  Filled 2012-11-25: qty 1

## 2012-11-25 MED ORDER — SODIUM CHLORIDE 0.9 % IJ SOLN: 3.0000 mL | Freq: Two times a day (BID) | INTRAMUSCULAR | Status: DC

## 2012-11-25 NOTE — ED Notes (Signed)
Family at bedside. 

## 2012-11-25 NOTE — ED Notes (Signed)
Pt asking for pain med for her headache 

## 2012-11-25 NOTE — ED Notes (Signed)
Dr j knapp at the bedside

## 2012-11-25 NOTE — ED Notes (Signed)
The pt is c/o some sob for 2 days.  She has a headache and chest pain with inhalation no edema in her feet and nakles.  Husband at the bedside

## 2012-11-25 NOTE — ED Notes (Addendum)
From MD's office - c/o SSCP onset last night 2200 & has remained constant. Reports SOB, denies n/v, diaphoresis. Per EMS - given NTG x1, office gave ASA 324mg . Pt reports no change in CP after NTG. Also stated has had non prod cough x 1 month & finished treatment of doxycycline & Z pack.
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
## 2012-11-25 NOTE — ED Notes (Signed)
The pt had 4 baby aspirin this amm 1100am.  Aspirin not given here for that reason

## 2012-11-25 NOTE — ED Notes (Signed)
Pt c/o sudden onset of substernal cp beginning last night "that goes into my head."  Pain is worse with deep inspiration. Pt also states she is sob and is worse with exertion. Pt took mylanta and flexiril for pain with no relief. Pt states pain is worse when laying down, states "it squeezes when I lay down." Pt states that when she sits up, pain "is more of an ache." Pt states she has had cough x6 weeks, has had 2 rounds of abx to treat cough. Cough does not make pain worse.

## 2012-11-25 NOTE — ED Notes (Signed)
To ct

## 2012-11-25 NOTE — ED Notes (Signed)
Pt most comfortable sitting up on the bedside.

## 2012-11-26 ENCOUNTER — Encounter: Payer: Self-pay | Admitting: Family Medicine

## 2012-11-26 LAB — CBC
HCT: 38.5 % (ref 36.0–46.0)
Hemoglobin: 12.8 g/dL (ref 12.0–15.0)
MCH: 30 pg (ref 26.0–34.0)
MCHC: 33.2 g/dL (ref 30.0–36.0)

## 2012-11-26 LAB — BASIC METABOLIC PANEL
BUN: 8 mg/dL (ref 6–23)
Calcium: 9.2 mg/dL (ref 8.4–10.5)
GFR calc non Af Amer: >90 mL/min (ref >90)
Glucose, Bld: 162 mg/dL — ABNORMAL HIGH (ref 70–99)
Potassium: 4.1 mEq/L (ref 3.5–5.1)

## 2012-11-26 LAB — TROPONIN I
Troponin I: <0.3 ng/mL (ref <0.30)
Troponin I: <0.3 ng/mL (ref <0.30)

## 2012-11-26 MED ORDER — METOPROLOL SUCCINATE 12.5 MG HALF TABLET: 12.5000 mg | ORAL_TABLET | Freq: Every day | ORAL | Status: DC

## 2012-11-26 MED ORDER — INDOMETHACIN ER 75 MG PO CPCR: 75.0000 mg | ORAL_CAPSULE | Freq: Two times a day (BID) | ORAL | Status: DC

## 2012-11-26 MED ORDER — PANTOPRAZOLE SODIUM 20 MG PO TBEC: 20.0000 mg | DELAYED_RELEASE_TABLET | Freq: Every day | ORAL | Status: DC

## 2012-11-26 MED ORDER — ASPIRIN EC 81 MG PO TBEC: 81.0000 mg | DELAYED_RELEASE_TABLET | Freq: Every day | ORAL | Status: DC

## 2012-11-26 NOTE — Discharge Summary (Signed)
Physician Discharge Summary  MARIGNY BORRE ZOX:[id number] DOB: March 16, 1951 DOA: 11/25/2012  PCP: No primary provider on file.  Admit date: 11/25/2012 Discharge date: 11/26/2012  Time spent: 35 minutes  Recommendations for Outpatient Follow-up:  1. Patient had some mild tachycardia during hospital stay was started on Toprol 2. Patient should continue aspirin 81 mg  3. Followup primary care physician in about a week  Discharge Diagnoses:  Principal Problem:   DM (diabetes mellitus) Active Problems:   Chest pain   Fibromyalgia   Discharge Condition: Good  Diet recommendation: Heart Healthy  Filed Weights   11/25/12 1808 11/26/12 0500  Weight: 118.6 kg (261 lb 7.5 oz) 118.4 kg (261 lb 0.4 oz)    History of present illness:  62 year old Caucasian female presented to the emergency room 11/25/2012 with chest pain worsens specifically by coughing. She states she's had upper respiratory symptoms for the past 6 weeks. She also had some shortness of breath and tachycardia which prompted a workup including a CT chest which was negative for pulmonary embolism the pain was worse when she laid down and felt like more of an ache. Pains seem to be relieved by nitroglycerin so and aspirin On exam on day of discharge she clearly had costochondritis component of her pain with pressure over the second third costochondral junctions causing similar pain. It was felt that her pain was more significant for costochondritis and cardiogenic. Because of some mild tachycardia probably related to anxiety, she was started on metoprolol She was given oral indomethacin 75 mg twice a day which seems to have started to relieve the pain. Have prescribed her a proton pump inhibitor as well to protect her stomach. She will also go home on aspirin 81 mg. She potentially may need outpatient stratification but she sounds are she's very low risk-echo ordered for hospital stay was canceled   Discharge Exam: Filed Vitals:    11/25/12 1621 11/25/12 1808 11/25/12 2100 11/26/12 0500  BP: 123/75 126/81 129/81 113/75  Pulse:  109  88  Temp:  98.3 F (36.8 C) 98.3 F (36.8 C) 98.1 F (36.7 C)  TempSrc:  Oral Oral Oral  Resp:  18 18 18   Height:  5\' 7"  (1.702 m)    Weight:  118.6 kg (261 lb 7.5 oz)  118.4 kg (261 lb 0.4 oz)  SpO2:  100% 96% 97%   Alert pleasant oriented Caucasian female obese no apparent distress Still has mild chest pain but a central chest in nature with pleuritic component and association with cough   General: Alert pleasant oriented Cardiovascular: S1-S2 no murmur rub or gallop Respiratory: Clinically clear  Discharge Instructions  Discharge Orders   Future Orders Complete By Expires     Diet - low sodium heart healthy  As directed     Increase activity slowly  As directed         Medication List    TAKE these medications       ACCUPRIL 20 MG tablet  Generic drug:  quinapril  Take 20 mg by mouth daily.     amLODipine 5 MG tablet  Commonly known as:  NORVASC  Take 5 mg by mouth daily.     aspirin EC 81 MG tablet  Take 1 tablet (81 mg total) by mouth daily.     Biotin 5000 MCG Caps  Take 1 capsule by mouth daily.     cyclobenzaprine 10 MG tablet  Commonly known as:  FLEXERIL  Take 10 mg by mouth 3 (three)  times daily as needed for muscle spasms.     indomethacin 75 MG CR capsule  Commonly known as:  INDOCIN SR  Take 1 capsule (75 mg total) by mouth 2 (two) times daily with a meal.     loratadine 10 MG tablet  Commonly known as:  CLARITIN  Take 10 mg by mouth daily as needed. allergies     metFORMIN 1000 MG tablet  Commonly known as:  GLUCOPHAGE  Take 1,000 mg by mouth 2 (two) times daily with a meal.     metoprolol succinate 12.5 mg Tb24  Commonly known as:  TOPROL-XL  Take 0.5 tablets (12.5 mg total) by mouth daily.     multivitamin with minerals Tabs  Take 1 tablet by mouth daily.     pantoprazole 20 MG tablet  Commonly known as:  PROTONIX  Take 1  tablet (20 mg total) by mouth daily.          The results of significant diagnostics from this hospitalization (including imaging, microbiology, ancillary and laboratory) are listed below for reference.    Significant Diagnostic Studies: Ct Angio Chest Pe W/cm &/or Wo Cm  11/25/2012  *RADIOLOGY REPORT*  Clinical Data: Tachycardia, chest pain  CT ANGIOGRAPHY CHEST  Technique:  Multidetector CT imaging of the chest using the standard protocol during bolus administration of intravenous contrast. Multiplanar reconstructed images including MIPs were obtained and reviewed to evaluate the vascular anatomy.  Contrast: OMNIPAQUE IOHEXOL 350 MG/ML SOLN  The patient was initially injected 80 ml Omnipaque-300 successfully however the scanner malfunctioned and the patient was not scanned following this injection.  This was repeated with a second dose of 80 ml contrast and again scanner malfunction was encountered. The patient was moved another scanner and a third injection with 50 ml contrast was performed however the acquisition was acquired late as a thoracic aorta protocol rather than a pulmonary embolus protocol. Therefore the pulmonary arteries are not enhanced.  Comparison: 11/25/2012, 02/06/2007  Findings: Thoracic aorta appears intact.  Negative for dissection. Bovine arch anatomy noted with common origin of the brachiocephalic and left common carotid arteries.  Left subclavian artery also patent.  No mediastinal hematoma appreciated.  No pericardial or pleural effusion.  Scattered small nonenlarged lymph nodes as before.  No hiatal hernia.  Upper abdominal imaging during the arterial phase demonstrates no acute process.  Lung windows demonstrate minimal dependent basilar atelectasis.  No suspicious pulmonary collapse, consolidation, airspace process, pneumonia, or edema. No abnormal interstitial process.  Central airways and trachea patent.  Stable 9 mm left lower lobe nodule compared to 02/06/2007  compatible with a benign nodule.  IMPRESSION: Intact thoracic aorta.  Negative for dissection or aneurysm.  No acute intrathoracic process.  Bibasilar atelectasis  Stable 9 mm left lower lobe nodule  Findings of exam and difficulties with the acquisition discussed with Dr.  Juleen China.   Original Report Authenticated By: Judie Petit. Shick, M.D.    Dg Chest Portable 1 View  11/25/2012  *RADIOLOGY REPORT*  Clinical Data: Chest pain.  PORTABLE CHEST - 1 VIEW  Comparison: March 19, 2012.  Findings: Cardiomediastinal silhouette appears normal.  No acute pulmonary disease is noted.  Bony thorax is intact.  IMPRESSION: No acute cardiopulmonary abnormality seen.   Original Report Authenticated By: Lupita Raider.,  M.D.     Microbiology: No results found for this or any previous visit (from the past 240 hour(s)).   Labs: Basic Metabolic Panel:  Recent Labs Lab 11/25/12 1335 11/25/12 1906 11/26/12 9604  NA 139  --  138  K 4.2  --  4.1  CL 99  --  101  CO2 26  --  27  GLUCOSE 221*  --  162*  BUN 6  --  8  CREATININE 0.50 0.63 0.52  CALCIUM 10.2  --  9.2   Liver Function Tests:  Recent Labs Lab 11/25/12 1335  AST 30  ALT 38*  ALKPHOS 135*  BILITOT 0.4  PROT 7.4  ALBUMIN 4.0   No results found for this basename: LIPASE, AMYLASE,  in the last 168 hours No results found for this basename: AMMONIA,  in the last 168 hours CBC:  Recent Labs Lab 11/25/12 1335 11/25/12 1906 11/26/12 0648  WBC 12.8* 12.4* 8.6  HGB 15.0 14.3 12.8  HCT 43.7 42.7 38.5  MCV 91.2 92.0 90.4  PLT 311 310 251   Cardiac Enzymes:  Recent Labs Lab 11/25/12 1915 11/26/12 0106 11/26/12 0645  TROPONINI <0.30 <0.30 <0.30   BNP: BNP (last 3 results) No results found for this basename: PROBNP,  in the last 8760 hours CBG:  Recent Labs Lab 11/26/12 0726  GLUCAP 148*       SignedRhetta Mura  [facility] 11/26/2012, 8:52 AM

## 2012-12-23 ENCOUNTER — Other Ambulatory Visit: Payer: Self-pay | Admitting: Orthopedic Surgery

## 2012-12-23 DIAGNOSIS — M545 Low back pain: Secondary | ICD-10-CM

## 2012-12-30 ENCOUNTER — Ambulatory Visit
Admission: RE | Admit: 2012-12-30 | Discharge: 2012-12-30 | Disposition: A | Discharge status: Home / Self Care | Payer: BC Managed Care – PPO | Source: Ambulatory Visit | Attending: Orthopedic Surgery | Admitting: Orthopedic Surgery

## 2012-12-30 DIAGNOSIS — M545 Low back pain: Secondary | ICD-10-CM

## 2013-05-27 ENCOUNTER — Telehealth: Payer: Self-pay | Admitting: Genetic Counselor

## 2013-05-27 NOTE — Telephone Encounter (Signed)
Pt called and has declined genetic appt.

## 2013-05-27 NOTE — Telephone Encounter (Signed)
Left pt vm to return call in ref to genetic appt. °

## 2013-05-28 ENCOUNTER — Other Ambulatory Visit: Payer: Self-pay | Admitting: Obstetrics and Gynecology

## 2013-05-28 DIAGNOSIS — Z803 Family history of malignant neoplasm of breast: Secondary | ICD-10-CM

## 2013-07-22 ENCOUNTER — Other Ambulatory Visit: Payer: Self-pay | Admitting: Physical Medicine and Rehabilitation

## 2013-07-22 ENCOUNTER — Ambulatory Visit
Admission: RE | Admit: 2013-07-22 | Discharge: 2013-07-22 | Disposition: A | Discharge status: Home / Self Care | Payer: BC Managed Care – PPO | Source: Ambulatory Visit | Attending: Physical Medicine and Rehabilitation | Admitting: Physical Medicine and Rehabilitation

## 2013-09-01 ENCOUNTER — Telehealth: Payer: Self-pay | Admitting: Cardiology

## 2013-09-01 NOTE — Telephone Encounter (Signed)
To Dr Turner to advise 

## 2013-09-01 NOTE — Telephone Encounter (Signed)
New message  Pt called states that she would like to switch to a mouth guard vs Cpap machine// Please call to discuss further.

## 2013-09-03 NOTE — Telephone Encounter (Signed)
On your desk. Pt has not seen [place] since this appt even after the new sleep study

## 2013-09-03 NOTE — Telephone Encounter (Signed)
Please get her last OV

## 2013-09-19 NOTE — Telephone Encounter (Signed)
LVM for pt to return call

## 2013-09-19 NOTE — Telephone Encounter (Signed)
Please set her up with an appt with me to discuss options

## 2013-09-22 NOTE — Telephone Encounter (Signed)
lvm for pt to return call °

## 2013-09-23 NOTE — Telephone Encounter (Signed)
Pt is aware of MD recommendations. Pt has an appointment with Dr. Radford Pax on 10/07/13 at 4:15 Pm. Pt aware.

## 2013-09-23 NOTE — Telephone Encounter (Signed)
Follow up     Returning calling back to nurse

## 2013-10-07 ENCOUNTER — Ambulatory Visit (INDEPENDENT_AMBULATORY_CARE_PROVIDER_SITE_OTHER): Payer: BC Managed Care – PPO | Admitting: Cardiology

## 2013-10-07 ENCOUNTER — Encounter: Payer: Self-pay | Admitting: Cardiology

## 2013-10-07 ENCOUNTER — Encounter: Payer: Self-pay | Admitting: General Surgery

## 2013-10-07 VITALS — BP 118/72 | HR 74 | Ht 66.0 in | Wt 264.0 lb

## 2013-10-07 DIAGNOSIS — I10 Essential (primary) hypertension: Secondary | ICD-10-CM

## 2013-10-07 DIAGNOSIS — E669 Obesity, unspecified: Secondary | ICD-10-CM

## 2013-10-07 DIAGNOSIS — G4733 Obstructive sleep apnea (adult) (pediatric): Secondary | ICD-10-CM

## 2013-10-07 DIAGNOSIS — R000 Tachycardia, unspecified: Secondary | ICD-10-CM

## 2013-10-07 DIAGNOSIS — G4730 Sleep apnea, unspecified: Secondary | ICD-10-CM | POA: Insufficient documentation

## 2013-10-07 DIAGNOSIS — E785 Hyperlipidemia, unspecified: Secondary | ICD-10-CM

## 2013-10-07 NOTE — Patient Instructions (Signed)
Your physician recommends that you continue on your current medications as directed. Please refer to the Current Medication list given to you today.  Your physician wants you to follow-up in: 6 Months with Dr Turner You will receive a reminder letter in the mail two months in advance. If you don't receive a letter, please call our office to schedule the follow-up appointment.  
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
## 2014-01-27 ENCOUNTER — Other Ambulatory Visit: Payer: Self-pay | Admitting: Urology

## 2014-02-23 ENCOUNTER — Telehealth: Payer: Self-pay | Admitting: Cardiology

## 2014-02-23 DIAGNOSIS — G4733 Obstructive sleep apnea (adult) (pediatric): Secondary | ICD-10-CM

## 2014-02-23 NOTE — Telephone Encounter (Signed)
Pt needs an upated rx for all cpap supplies. OK to sent to [facility]?

## 2014-02-23 NOTE — Telephone Encounter (Signed)
Order sent.

## 2014-02-23 NOTE — Telephone Encounter (Signed)
yes

## 2014-03-06 ENCOUNTER — Encounter (HOSPITAL_COMMUNITY): Payer: Self-pay | Admitting: Pharmacy Technician

## 2014-03-16 ENCOUNTER — Encounter (HOSPITAL_COMMUNITY)
Admission: RE | Admit: 2014-03-16 | Discharge: 2014-03-16 | Disposition: A | Discharge status: Home / Self Care | Payer: BC Managed Care – PPO | Source: Ambulatory Visit | Attending: Urology | Admitting: Urology

## 2014-03-16 ENCOUNTER — Encounter (INDEPENDENT_AMBULATORY_CARE_PROVIDER_SITE_OTHER): Payer: Self-pay

## 2014-03-16 ENCOUNTER — Ambulatory Visit (HOSPITAL_COMMUNITY)
Admission: RE | Admit: 2014-03-16 | Discharge: 2014-03-16 | Disposition: A | Discharge status: Home / Self Care | Payer: BC Managed Care – PPO | Source: Ambulatory Visit | Attending: Anesthesiology | Admitting: Anesthesiology

## 2014-03-16 ENCOUNTER — Encounter (HOSPITAL_COMMUNITY): Payer: Self-pay

## 2014-03-16 DIAGNOSIS — Z981 Arthrodesis status: Secondary | ICD-10-CM | POA: Insufficient documentation

## 2014-03-16 DIAGNOSIS — E119 Type 2 diabetes mellitus without complications: Secondary | ICD-10-CM | POA: Insufficient documentation

## 2014-03-16 DIAGNOSIS — I771 Stricture of artery: Secondary | ICD-10-CM | POA: Insufficient documentation

## 2014-03-16 DIAGNOSIS — Z01812 Encounter for preprocedural laboratory examination: Secondary | ICD-10-CM | POA: Insufficient documentation

## 2014-03-16 DIAGNOSIS — I517 Cardiomegaly: Secondary | ICD-10-CM | POA: Insufficient documentation

## 2014-03-16 DIAGNOSIS — I10 Essential (primary) hypertension: Secondary | ICD-10-CM | POA: Insufficient documentation

## 2014-03-16 DIAGNOSIS — M47814 Spondylosis without myelopathy or radiculopathy, thoracic region: Secondary | ICD-10-CM | POA: Insufficient documentation

## 2014-03-16 DIAGNOSIS — I700 Atherosclerosis of aorta: Secondary | ICD-10-CM | POA: Insufficient documentation

## 2014-03-16 DIAGNOSIS — Z01818 Encounter for other preprocedural examination: Secondary | ICD-10-CM | POA: Insufficient documentation

## 2014-03-16 HISTORY — DX: Other microscopic hematuria: R31.29

## 2014-03-16 HISTORY — DX: Personal history of other malignant neoplasm of skin: Z85.828

## 2014-03-16 HISTORY — DX: Shortness of breath: R06.02

## 2014-03-16 HISTORY — DX: Other specified cough: R05.8

## 2014-03-16 HISTORY — DX: Calculus of kidney: N20.0

## 2014-03-16 HISTORY — DX: Cough: R05

## 2014-03-16 HISTORY — DX: Gastro-esophageal reflux disease without esophagitis: K21.9

## 2014-03-16 LAB — CBC
HCT: 37.4 % (ref 36.0–46.0)
HEMOGLOBIN: 12.1 g/dL (ref 12.0–15.0)
MCH: 29.4 pg (ref 26.0–34.0)
MCHC: 32.4 g/dL (ref 30.0–36.0)
MCV: 91 fL (ref 78.0–100.0)
Platelets: 396 10*3/uL (ref 150–400)
RBC: 4.11 MIL/uL (ref 3.87–5.11)
RDW: 13.4 % (ref 11.5–15.5)
WBC: 12.8 10*3/uL — AB (ref 4.0–10.5)

## 2014-03-16 LAB — BASIC METABOLIC PANEL
BUN: 11 mg/dL (ref 6–23)
CHLORIDE: 101 meq/L (ref 96–112)
CO2: 26 mEq/L (ref 19–32)
Calcium: 9.7 mg/dL (ref 8.4–10.5)
Creatinine, Ser: 0.58 mg/dL (ref 0.50–1.10)
GFR calc Af Amer: >90 mL/min (ref >90)
GFR calc non Af Amer: >90 mL/min (ref >90)
GLUCOSE: 138 mg/dL — AB (ref 70–99)
POTASSIUM: 4.2 meq/L (ref 3.7–5.3)
SODIUM: 141 meq/L (ref 137–147)

## 2014-03-16 NOTE — Patient Instructions (Signed)
Gwyn H Ilyas  03/16/2014                           YOUR PROCEDURE IS SCHEDULED ON: 03/18/14 AT 1:30 PM               [place] ENTRANCE AND                            FOLLOW  SIGNS TO SHORT STAY CENTER                 ARRIVE AT SHORT STAY AT: 11:30 AM               CALL THIS NUMBER IF ANY PROBLEMS THE DAY OF SURGERY :               [phone]                                REMEMBER:   Do not eat food  AFTER MIDNIGHT   May have clear liquids UNTIL 6 HOURS BEFORE SURGERY 7:30 AM               Take these medicines the morning of surgery with               A SIPS OF WATER :  AMLODIPINE /MYRBETRIQ / NITROFURANTOIN / PRAVASTATIN       Do not wear jewelry, make-up   Do not wear lotions, powders, or perfumes.   Do not shave legs or underarms 12 hrs. before surgery (men may shave face)  Do not bring valuables to the hospital.  Contacts, dentures or bridgework may not be worn into surgery.  Leave suitcase in the car. After surgery it may be brought to your room.  For patients admitted to the hospital more than one night, checkout time is            11:00 AM                                                        ________________________________________________________________________                                                             CLEAR LIQUID DIET   Foods Allowed                                                                     Foods Excluded  Coffee and tea, regular and decaf                             liquids that you cannot  Plain Jell-O in any flavor  see through such as: Fruit ices (not with fruit pulp)                                     milk, soups, orange juice  Iced Popsicles                                    All solid food Carbonated beverages, regular and diet                                    Cranberry, grape and apple juices Sports drinks like Gatorade Lightly  seasoned clear broth or consume(fat free) Sugar, honey syrup   _____________________________________________________________________                No Name  Before surgery, you can play an important role.  Because skin is not sterile, your skin needs to be as free of germs as possible.  You can reduce the number of germs on your skin by washing with CHG (chlorahexidine gluconate) soap before surgery.  CHG is an antiseptic cleaner which kills germs and bonds with the skin to continue killing germs even after washing. Please DO NOT use if you have an allergy to CHG or antibacterial soaps.  If your skin becomes reddened/irritated stop using the CHG and inform your nurse when you arrive at Short Stay. Do not shave (including legs and underarms) for at least 48 hours prior to the first CHG shower.  You may shave your face. Please follow these instructions carefully:   1.  Shower with CHG Soap the night before surgery and the  morning of Surgery.   2.  If you choose to wash your hair, wash your hair first as usual with your  normal  Shampoo.   3.  After you shampoo, rinse your hair and body thoroughly to remove the  shampoo.                                         4.  Use CHG as you would any other liquid soap.  You can apply chg directly  to the skin and wash . Gently wash with scrungie or clean wascloth    5.  Apply the CHG Soap to your body ONLY FROM THE NECK DOWN.   Do not use on open                           Wound or open sores. Avoid contact with eyes, ears mouth and genitals (private parts).                        Genitals (private parts) with your normal soap.              6.  Wash thoroughly, paying special attention to the area where your surgery  will be performed.   7.  Thoroughly rinse your body with warm water from the neck down.   8.  DO NOT shower/wash with your normal soap after using and rinsing off  the CHG Soap .  9.  Pat  yourself dry with a clean towel.             10.  Wear clean pajamas.             11.  Place clean sheets on your bed the night of your first shower and do not  sleep with pets.  Day of Surgery : Do not apply any lotions/deodorants the morning of surgery.  Please wear clean clothes to the hospital/surgery center.  FAILURE TO FOLLOW THESE INSTRUCTIONS MAY RESULT IN THE CANCELLATION OF YOUR SURGERY    PATIENT SIGNATURE_________________________________  ______________________________________________________________________

## 2014-03-17 MED ORDER — GENTAMICIN SULFATE 40 MG/ML IJ SOLN
420.0000 mg | INTRAVENOUS | Status: AC
  Administered 2014-03-18: 420 mg via INTRAVENOUS
  Filled 2014-03-17: qty 10.5

## 2014-03-18 ENCOUNTER — Ambulatory Visit (HOSPITAL_COMMUNITY): Payer: BC Managed Care – PPO | Admitting: Anesthesiology

## 2014-03-18 ENCOUNTER — Ambulatory Visit (HOSPITAL_COMMUNITY): Payer: BC Managed Care – PPO

## 2014-03-18 ENCOUNTER — Encounter (HOSPITAL_COMMUNITY): Payer: Self-pay | Admitting: *Deleted

## 2014-03-18 ENCOUNTER — Encounter (HOSPITAL_COMMUNITY): Payer: BC Managed Care – PPO | Admitting: Anesthesiology

## 2014-03-18 ENCOUNTER — Encounter (HOSPITAL_COMMUNITY)
Admission: RE | Disposition: A | Discharge status: Home / Self Care | Payer: Self-pay | Source: Ambulatory Visit | Attending: Urology

## 2014-03-18 ENCOUNTER — Inpatient Hospital Stay (HOSPITAL_COMMUNITY)
Admission: RE | Admit: 2014-03-18 | Discharge: 2014-03-22 | DRG: 660 | Disposition: A | Discharge status: Home / Self Care | Payer: BC Managed Care – PPO | Source: Ambulatory Visit | Attending: Urology | Admitting: Urology

## 2014-03-18 DIAGNOSIS — IMO0002 Reserved for concepts with insufficient information to code with codable children: Secondary | ICD-10-CM

## 2014-03-18 DIAGNOSIS — F411 Generalized anxiety disorder: Secondary | ICD-10-CM | POA: Diagnosis present

## 2014-03-18 DIAGNOSIS — Z85828 Personal history of other malignant neoplasm of skin: Secondary | ICD-10-CM

## 2014-03-18 DIAGNOSIS — N200 Calculus of kidney: Principal | ICD-10-CM | POA: Diagnosis present

## 2014-03-18 DIAGNOSIS — N3289 Other specified disorders of bladder: Secondary | ICD-10-CM | POA: Diagnosis present

## 2014-03-18 DIAGNOSIS — N3946 Mixed incontinence: Secondary | ICD-10-CM | POA: Diagnosis present

## 2014-03-18 DIAGNOSIS — I10 Essential (primary) hypertension: Secondary | ICD-10-CM | POA: Diagnosis present

## 2014-03-18 DIAGNOSIS — G4733 Obstructive sleep apnea (adult) (pediatric): Secondary | ICD-10-CM | POA: Diagnosis present

## 2014-03-18 DIAGNOSIS — Z87442 Personal history of urinary calculi: Secondary | ICD-10-CM

## 2014-03-18 DIAGNOSIS — Z87440 Personal history of urinary (tract) infections: Secondary | ICD-10-CM

## 2014-03-18 DIAGNOSIS — Z881 Allergy status to other antibiotic agents status: Secondary | ICD-10-CM

## 2014-03-18 DIAGNOSIS — IMO0001 Reserved for inherently not codable concepts without codable children: Secondary | ICD-10-CM | POA: Diagnosis present

## 2014-03-18 DIAGNOSIS — E119 Type 2 diabetes mellitus without complications: Secondary | ICD-10-CM | POA: Diagnosis present

## 2014-03-18 DIAGNOSIS — K219 Gastro-esophageal reflux disease without esophagitis: Secondary | ICD-10-CM | POA: Diagnosis present

## 2014-03-18 DIAGNOSIS — Z8673 Personal history of transient ischemic attack (TIA), and cerebral infarction without residual deficits: Secondary | ICD-10-CM

## 2014-03-18 DIAGNOSIS — N3020 Other chronic cystitis without hematuria: Secondary | ICD-10-CM | POA: Diagnosis present

## 2014-03-18 DIAGNOSIS — M1710 Unilateral primary osteoarthritis, unspecified knee: Secondary | ICD-10-CM | POA: Diagnosis present

## 2014-03-18 DIAGNOSIS — N12 Tubulo-interstitial nephritis, not specified as acute or chronic: Secondary | ICD-10-CM | POA: Diagnosis present

## 2014-03-18 DIAGNOSIS — Z9109 Other allergy status, other than to drugs and biological substances: Secondary | ICD-10-CM

## 2014-03-18 DIAGNOSIS — Z801 Family history of malignant neoplasm of trachea, bronchus and lung: Secondary | ICD-10-CM

## 2014-03-18 HISTORY — PX: CYSTOSCOPY W/ URETERAL STENT PLACEMENT: SHX1429

## 2014-03-18 HISTORY — PX: HOLMIUM LASER APPLICATION: SHX5852

## 2014-03-18 HISTORY — PX: NEPHROLITHOTOMY: SHX5134

## 2014-03-18 LAB — HEMOGLOBIN AND HEMATOCRIT, BLOOD
HEMATOCRIT: 36 % (ref 36.0–46.0)
HEMOGLOBIN: 11.5 g/dL — AB (ref 12.0–15.0)

## 2014-03-18 LAB — BASIC METABOLIC PANEL
ANION GAP: 13 (ref 5–15)
BUN: 10 mg/dL (ref 6–23)
CHLORIDE: 97 meq/L (ref 96–112)
CO2: 25 mEq/L (ref 19–32)
Calcium: 9.1 mg/dL (ref 8.4–10.5)
Creatinine, Ser: 0.71 mg/dL (ref 0.50–1.10)
GFR, EST NON AFRICAN AMERICAN: 90 mL/min — AB (ref >90)
Glucose, Bld: 207 mg/dL — ABNORMAL HIGH (ref 70–99)
POTASSIUM: 4.2 meq/L (ref 3.7–5.3)
SODIUM: 135 meq/L — AB (ref 137–147)

## 2014-03-18 LAB — GLUCOSE, CAPILLARY
GLUCOSE-CAPILLARY: 310 mg/dL — AB (ref 70–99)
Glucose-Capillary: 159 mg/dL — ABNORMAL HIGH (ref 70–99)
Glucose-Capillary: 148 mg/dL — ABNORMAL HIGH (ref 70–99)

## 2014-03-18 SURGERY — NEPHROLITHOTOMY PERCUTANEOUS
Anesthesia: General | Laterality: Right

## 2014-03-18 MED ORDER — CISATRACURIUM BESYLATE 20 MG/10ML IV SOLN
INTRAVENOUS | Status: AC
  Filled 2014-03-18: qty 10

## 2014-03-18 MED ORDER — IOHEXOL 300 MG/ML  SOLN
INTRAMUSCULAR | Status: DC | PRN
  Administered 2014-03-18: 200 mL

## 2014-03-18 MED ORDER — PROPOFOL 10 MG/ML IV BOLUS
INTRAVENOUS | Status: AC
  Filled 2014-03-18: qty 20

## 2014-03-18 MED ORDER — FLUTICASONE PROPIONATE 50 MCG/ACT NA SUSP
2.0000 | Freq: Every day | NASAL | Status: DC
  Administered 2014-03-21 – 2014-03-22 (×2): 2 via NASAL
  Filled 2014-03-18: qty 16

## 2014-03-18 MED ORDER — DEXAMETHASONE SODIUM PHOSPHATE 10 MG/ML IJ SOLN
INTRAMUSCULAR | Status: AC
  Filled 2014-03-18: qty 1

## 2014-03-18 MED ORDER — HYDROMORPHONE HCL PF 1 MG/ML IJ SOLN
0.5000 mg | INTRAMUSCULAR | Status: DC | PRN
Start: 2014-03-18 — End: 2014-03-22
  Administered 2014-03-20 – 2014-03-21 (×4): 1 mg via INTRAVENOUS
  Filled 2014-03-18 (×5): qty 1

## 2014-03-18 MED ORDER — ONDANSETRON HCL 4 MG/2ML IJ SOLN
INTRAMUSCULAR | Status: AC
  Filled 2014-03-18: qty 2

## 2014-03-18 MED ORDER — ACETAMINOPHEN 500 MG PO TABS
1000.0000 mg | ORAL_TABLET | Freq: Three times a day (TID) | ORAL | Status: AC
  Administered 2014-03-18 – 2014-03-19 (×3): 1000 mg via ORAL
  Filled 2014-03-18 (×3): qty 2

## 2014-03-18 MED ORDER — INSULIN ASPART 100 UNIT/ML ~~LOC~~ SOLN
0.0000 [IU] | Freq: Three times a day (TID) | SUBCUTANEOUS | Status: DC
  Administered 2014-03-19 (×3): 3 [IU] via SUBCUTANEOUS
  Administered 2014-03-20: 5 [IU] via SUBCUTANEOUS
  Administered 2014-03-20: 3 [IU] via SUBCUTANEOUS
  Administered 2014-03-20: 5 [IU] via SUBCUTANEOUS
  Administered 2014-03-21: 3 [IU] via SUBCUTANEOUS
  Administered 2014-03-21 (×2): 5 [IU] via SUBCUTANEOUS
  Administered 2014-03-22: 3 [IU] via SUBCUTANEOUS

## 2014-03-18 MED ORDER — HYDROMORPHONE HCL PF 2 MG/ML IJ SOLN
INTRAMUSCULAR | Status: AC
  Filled 2014-03-18: qty 1

## 2014-03-18 MED ORDER — SODIUM CHLORIDE 0.9 % IJ SOLN
INTRAMUSCULAR | Status: AC
  Filled 2014-03-18: qty 10

## 2014-03-18 MED ORDER — DOCUSATE SODIUM 100 MG PO CAPS
100.0000 mg | ORAL_CAPSULE | Freq: Two times a day (BID) | ORAL | Status: DC
  Administered 2014-03-18 – 2014-03-22 (×8): 100 mg via ORAL
  Filled 2014-03-18 (×9): qty 1

## 2014-03-18 MED ORDER — HYDROMORPHONE HCL PF 1 MG/ML IJ SOLN: 0.2500 mg | INTRAMUSCULAR | Status: DC | PRN

## 2014-03-18 MED ORDER — PROMETHAZINE HCL 25 MG/ML IJ SOLN: 6.2500 mg | INTRAMUSCULAR | Status: DC | PRN

## 2014-03-18 MED ORDER — ONDANSETRON HCL 4 MG/2ML IJ SOLN
4.0000 mg | INTRAMUSCULAR | Status: DC | PRN
  Administered 2014-03-21: 4 mg via INTRAVENOUS
  Filled 2014-03-18: qty 2

## 2014-03-18 MED ORDER — LACTATED RINGERS IV SOLN
INTRAVENOUS | Status: DC
  Administered 2014-03-18: 14:00:00 via INTRAVENOUS
  Administered 2014-03-18: 1000 mL via INTRAVENOUS

## 2014-03-18 MED ORDER — PNEUMOCOCCAL VAC POLYVALENT 25 MCG/0.5ML IJ INJ
0.5000 mL | INJECTION | INTRAMUSCULAR | Status: AC
  Administered 2014-03-21: 0.5 mL via INTRAMUSCULAR
  Filled 2014-03-18 (×3): qty 0.5

## 2014-03-18 MED ORDER — OXYCODONE HCL 5 MG PO TABS
5.0000 mg | ORAL_TABLET | ORAL | Status: DC | PRN
  Administered 2014-03-19 – 2014-03-22 (×7): 5 mg via ORAL
  Filled 2014-03-18 (×7): qty 1

## 2014-03-18 MED ORDER — CIPROFLOXACIN IN D5W 400 MG/200ML IV SOLN
400.0000 mg | Freq: Two times a day (BID) | INTRAVENOUS | Status: DC
  Administered 2014-03-18 – 2014-03-21 (×6): 400 mg via INTRAVENOUS
  Filled 2014-03-18 (×7): qty 200

## 2014-03-18 MED ORDER — ONDANSETRON HCL 4 MG/2ML IJ SOLN
INTRAMUSCULAR | Status: DC | PRN
  Administered 2014-03-18: 4 mg via INTRAVENOUS

## 2014-03-18 MED ORDER — SODIUM CHLORIDE 0.9 % IR SOLN
Status: DC | PRN
  Administered 2014-03-18: 15000 mL via INTRAVESICAL

## 2014-03-18 MED ORDER — LIDOCAINE HCL (CARDIAC) 20 MG/ML IV SOLN
INTRAVENOUS | Status: AC
  Filled 2014-03-18: qty 5

## 2014-03-18 MED ORDER — SENNA 8.6 MG PO TABS
1.0000 | ORAL_TABLET | Freq: Two times a day (BID) | ORAL | Status: DC
  Administered 2014-03-18 – 2014-03-22 (×8): 8.6 mg via ORAL
  Filled 2014-03-18 (×8): qty 1

## 2014-03-18 MED ORDER — MIRABEGRON ER 50 MG PO TB24
50.0000 mg | ORAL_TABLET | Freq: Every day | ORAL | Status: DC
  Administered 2014-03-18 – 2014-03-22 (×5): 50 mg via ORAL
  Filled 2014-03-18 (×5): qty 1

## 2014-03-18 MED ORDER — LORATADINE 10 MG PO TABS
10.0000 mg | ORAL_TABLET | Freq: Every day | ORAL | Status: DC | PRN
  Filled 2014-03-18: qty 1

## 2014-03-18 MED ORDER — FENTANYL CITRATE 0.05 MG/ML IJ SOLN
INTRAMUSCULAR | Status: AC
  Filled 2014-03-18: qty 5

## 2014-03-18 MED ORDER — MIDAZOLAM HCL 5 MG/5ML IJ SOLN
INTRAMUSCULAR | Status: DC | PRN
  Administered 2014-03-18: 2 mg via INTRAVENOUS

## 2014-03-18 MED ORDER — SUCCINYLCHOLINE CHLORIDE 20 MG/ML IJ SOLN
INTRAMUSCULAR | Status: DC | PRN
  Administered 2014-03-18 (×2): 100 mg via INTRAVENOUS

## 2014-03-18 MED ORDER — FENTANYL CITRATE 0.05 MG/ML IJ SOLN
INTRAMUSCULAR | Status: DC | PRN
  Administered 2014-03-18 (×2): 50 ug via INTRAVENOUS
  Administered 2014-03-18 (×2): 100 ug via INTRAVENOUS
  Administered 2014-03-18: 50 ug via INTRAVENOUS

## 2014-03-18 MED ORDER — FENTANYL CITRATE 0.05 MG/ML IJ SOLN
INTRAMUSCULAR | Status: AC
  Filled 2014-03-18: qty 2

## 2014-03-18 MED ORDER — CISATRACURIUM BESYLATE (PF) 10 MG/5ML IV SOLN
INTRAVENOUS | Status: DC | PRN
  Administered 2014-03-18: 6 mg via INTRAVENOUS
  Administered 2014-03-18: 10 mg via INTRAVENOUS
  Administered 2014-03-18: 4 mg via INTRAVENOUS

## 2014-03-18 MED ORDER — PROPOFOL 10 MG/ML IV BOLUS
INTRAVENOUS | Status: DC | PRN
  Administered 2014-03-18: 150 mg via INTRAVENOUS
  Administered 2014-03-18: 50 mg via INTRAVENOUS

## 2014-03-18 MED ORDER — HYDROMORPHONE HCL PF 1 MG/ML IJ SOLN
INTRAMUSCULAR | Status: DC | PRN
  Administered 2014-03-18: 0.5 mg via INTRAVENOUS

## 2014-03-18 MED ORDER — TRIAMCINOLONE ACETONIDE 55 MCG/ACT NA AERO: 2.0000 | INHALATION_SPRAY | Freq: Every day | NASAL | Status: DC

## 2014-03-18 MED ORDER — DIATRIZOATE MEGLUMINE 30 % UR SOLN
URETHRAL | Status: DC | PRN
  Administered 2014-03-18: 300 mL via URETHRAL

## 2014-03-18 MED ORDER — DEXAMETHASONE SODIUM PHOSPHATE 10 MG/ML IJ SOLN
INTRAMUSCULAR | Status: DC | PRN
  Administered 2014-03-18: 5 mg via INTRAVENOUS

## 2014-03-18 MED ORDER — MIDAZOLAM HCL 2 MG/2ML IJ SOLN
INTRAMUSCULAR | Status: AC
  Filled 2014-03-18: qty 2

## 2014-03-18 MED ORDER — AMLODIPINE BESYLATE 5 MG PO TABS
5.0000 mg | ORAL_TABLET | Freq: Every morning | ORAL | Status: DC
  Administered 2014-03-19 – 2014-03-22 (×4): 5 mg via ORAL
  Filled 2014-03-18 (×4): qty 1

## 2014-03-18 MED ORDER — LIDOCAINE HCL (PF) 2 % IJ SOLN
INTRAMUSCULAR | Status: DC | PRN
  Administered 2014-03-18: 75 mg via INTRADERMAL

## 2014-03-18 MED ORDER — SIMVASTATIN 10 MG PO TABS
10.0000 mg | ORAL_TABLET | Freq: Every day | ORAL | Status: DC
  Administered 2014-03-19 – 2014-03-21 (×3): 10 mg via ORAL
  Filled 2014-03-18 (×4): qty 1

## 2014-03-18 MED ORDER — 0.9 % SODIUM CHLORIDE (POUR BTL) OPTIME
TOPICAL | Status: DC | PRN
  Administered 2014-03-18: 1000 mL

## 2014-03-18 MED ORDER — POTASSIUM CHLORIDE IN NACL 20-0.9 MEQ/L-% IV SOLN
INTRAVENOUS | Status: DC
  Administered 2014-03-18 – 2014-03-21 (×5): via INTRAVENOUS
  Filled 2014-03-18 (×8): qty 1000

## 2014-03-18 MED ORDER — INSULIN ASPART 100 UNIT/ML ~~LOC~~ SOLN
4.0000 [IU] | Freq: Three times a day (TID) | SUBCUTANEOUS | Status: DC
  Administered 2014-03-19 – 2014-03-22 (×6): 4 [IU] via SUBCUTANEOUS

## 2014-03-18 SURGICAL SUPPLY — 66 items
APL ESCP 34 STRL LF DISP (HEMOSTASIS) ×1
APL SKNCLS STERI-STRIP NONHPOA (GAUZE/BANDAGES/DRESSINGS) ×2
APPLICATOR SURGIFLO ENDO (HEMOSTASIS) ×2 IMPLANT
BAG URINE DRAINAGE (UROLOGICAL SUPPLIES) ×4 IMPLANT
BASKET ZERO TIP NITINOL 2.4FR (BASKET) ×5 IMPLANT
BENZOIN TINCTURE PRP APPL 2/3 (GAUZE/BANDAGES/DRESSINGS) ×6 IMPLANT
BLADE SURG 15 STRL LF DISP TIS (BLADE) ×1 IMPLANT
BLADE SURG 15 STRL SS (BLADE) ×3
BSKT STON RTRVL ZERO TP 2.4FR (BASKET) ×2
CATCHER STONE W/TUBE ADAPTER (UROLOGICAL SUPPLIES) ×1 IMPLANT
CATH BEACON 5.038 65CM KMP-01 (CATHETERS) ×3 IMPLANT
CATH FOLEY 2W COUNCIL 20FR 5CC (CATHETERS) IMPLANT
CATH FOLEY 2WAY SLVR  5CC 16FR (CATHETERS) ×2
CATH FOLEY 2WAY SLVR 5CC 16FR (CATHETERS) ×1 IMPLANT
CATH INTERMIT  6FR 70CM (CATHETERS) ×3 IMPLANT
CATH ROBINSON RED A/P 20FR (CATHETERS) IMPLANT
CATH X-FORCE N30 NEPHROSTOMY (TUBING) ×3 IMPLANT
CHLORAPREP W/TINT 26ML (MISCELLANEOUS) ×6 IMPLANT
COVER SURGICAL LIGHT HANDLE (MISCELLANEOUS) ×3 IMPLANT
DRAPE C-ARM 42X120 X-RAY (DRAPES) ×3 IMPLANT
DRAPE CAMERA CLOSED 9X96 (DRAPES) ×6 IMPLANT
DRAPE LG THREE QUARTER DISP (DRAPES) ×3 IMPLANT
DRAPE LINGEMAN PERC (DRAPES) ×3 IMPLANT
DRAPE SURG IRRIG POUCH 19X23 (DRAPES) ×3 IMPLANT
DRSG PAD ABDOMINAL 8X10 ST (GAUZE/BANDAGES/DRESSINGS) ×6 IMPLANT
DRSG TEGADERM 8X12 (GAUZE/BANDAGES/DRESSINGS) ×4 IMPLANT
FIBER LASER FLEXIVA 1000 (UROLOGICAL SUPPLIES) IMPLANT
FIBER LASER FLEXIVA 200 (UROLOGICAL SUPPLIES) ×2 IMPLANT
FIBER LASER FLEXIVA 550 (UROLOGICAL SUPPLIES) IMPLANT
GAUZE SPONGE 4X4 12PLY STRL (GAUZE/BANDAGES/DRESSINGS) ×2 IMPLANT
GLOVE BIOGEL M STRL SZ7.5 (GLOVE) ×9 IMPLANT
GOWN STRL REUS W/TWL XL LVL3 (GOWN DISPOSABLE) ×9 IMPLANT
GUIDEWIRE AMPLAZ .035X145 (WIRE) ×6 IMPLANT
GUIDEWIRE ANG ZIPWIRE 038X150 (WIRE) ×8 IMPLANT
GUIDEWIRE STR DUAL SENSOR (WIRE) ×2 IMPLANT
KIT BASIN OR (CUSTOM PROCEDURE TRAY) ×3 IMPLANT
MANIFOLD NEPTUNE II (INSTRUMENTS) ×3 IMPLANT
MARKER SKIN DUAL TIP RULER LAB (MISCELLANEOUS) ×2 IMPLANT
NDL TROCAR 18X15 ECHO (NEEDLE) IMPLANT
NDL TROCAR 18X20 (NEEDLE) IMPLANT
NEEDLE TROCAR 18X15 ECHO (NEEDLE) ×3 IMPLANT
NEEDLE TROCAR 18X20 (NEEDLE) IMPLANT
NS IRRIG 1000ML POUR BTL (IV SOLUTION) ×1 IMPLANT
PACK BASIC VI WITH GOWN DISP (CUSTOM PROCEDURE TRAY) ×3 IMPLANT
PACK CYSTO (CUSTOM PROCEDURE TRAY) ×3 IMPLANT
PAD ABD 8X10 STRL (GAUZE/BANDAGES/DRESSINGS) ×2 IMPLANT
PROBE LITHOCLAST ULTRA 3.8X403 (UROLOGICAL SUPPLIES) ×2 IMPLANT
PROBE PNEUMATIC 1.0MMX570MM (UROLOGICAL SUPPLIES) ×3 IMPLANT
SET IRRIG Y TYPE TUR BLADDER L (SET/KITS/TRAYS/PACK) ×3 IMPLANT
SET WARMING FLUID IRRIGATION (MISCELLANEOUS) IMPLANT
SHEATH PEELAWAY SET 9 (SHEATH) ×2 IMPLANT
SPONGE GAUZE 4X4 12PLY (GAUZE/BANDAGES/DRESSINGS) ×2 IMPLANT
SPONGE LAP 4X18 X RAY DECT (DISPOSABLE) ×3 IMPLANT
STENT CONTOUR 6FRX24X.038 (STENTS) ×2 IMPLANT
STONE CATCHER W/TUBE ADAPTER (UROLOGICAL SUPPLIES) ×3 IMPLANT
SURGIFLO W/THROMBIN 8M KIT (HEMOSTASIS) ×2 IMPLANT
SUT SILK 2 0 30  PSL (SUTURE)
SUT SILK 2 0 30 PSL (SUTURE) ×1 IMPLANT
SUT VIC AB 3-0 SH 27 (SUTURE) ×3
SUT VIC AB 3-0 SH 27X BRD (SUTURE) IMPLANT
SYRINGE 12CC LL (MISCELLANEOUS) ×3 IMPLANT
SYRINGE 20CC LL (MISCELLANEOUS) ×6 IMPLANT
TOWEL OR 17X26 10 PK STRL BLUE (TOWEL DISPOSABLE) ×3 IMPLANT
TUBING CONNECTING 10 (TUBING) ×6 IMPLANT
TUBING CONNECTING 10' (TUBING) ×3
WATER STERILE IRR 1500ML POUR (IV SOLUTION) ×1 IMPLANT

## 2014-03-18 NOTE — Anesthesia Postprocedure Evaluation (Signed)
  Anesthesia Post-op Note  Patient: Denise [name]  Procedure(s) Performed: Procedure(s) (LRB): NEPHROLITHOTOMY PERCUTANEOUS WITH ACCESS (Right) CYSTOSCOPY WITH RETROGRADE PYELOGRAM/URETEROSCOPY WITH URETERAL STENT PLACEMENT (Right) HOLMIUM LASER APPLICATION (Right)  Patient Location: PACU  Anesthesia Type: General  Level of Consciousness: awake and alert   Airway and Oxygen Therapy: Patient Spontanous Breathing  Post-op Pain: mild  Post-op Assessment: Post-op Vital signs reviewed, Patient's Cardiovascular Status Stable, Respiratory Function Stable, Patent Airway and No signs of Nausea or vomiting  Last Vitals:  Filed Vitals:   03/18/14 1730  BP: 143/74  Pulse: 110  Temp: 37.3 C  Resp:     Post-op Vital Signs: stable   Complications: No apparent anesthesia complications. Slight tachycardia and tachypnea. Dr. Tresa Moore aware.
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
## 2014-03-18 NOTE — Progress Notes (Signed)
Pt placed on CPAP at 10 CMH20 per home settings with 4 LPM O2 bleed in via pt nasal pillows.  Pt tolerating well at this time, RT to monitor and assess as needed.

## 2014-03-18 NOTE — Op Note (Deleted)
NAME:  Denise [name], Denise [name]                  ACCOUNT NO.:  [id number]  MEDICAL RECORD NO.:  [id number]  LOCATION:                               FACILITY:  [facility]  PHYSICIAN:  Alexis Frock, MD     DATE OF BIRTH:  12/12/1950  DATE OF PROCEDURE:  03/18/2014 DATE OF DISCHARGE:  03/22/2014                              OPERATIVE REPORT   DIAGNOSIS:  Recurrent tract infections, right partial staghorn kidney stone.  PROCEDURE: 1. Cystoscopy with right retrograde pyelogram and interpretation. 2. Right ureteral stent placement. 3. Right antegrade nephrostogram and interpretation. 4. Right ureteroscopy with laser lithotripsy. 5. Right percutaneous nephrostolithotomy stone greater than 2 cm. 6. Right percutaneous access to the kidney with dilation of tract.  ESTIMATED BLOOD LOSS:  100 mL.  COMPLICATIONS:  None.  SPECIMEN:  Right renal and ureteral stone fragments for compositional analysis.  FINDINGS: 1. Unremarkable urinary bladder. 2. Large filling defects in the upper pole, the right kidney     consistent with partial staghorn kidney stone on right retrograde     pyelogram. 3. Excellent access to the right upper pole kidney with through and     through access to the urinary bladder. 4. No excessive extravasation of contrast on final right antegrade     nephrostogram. 5. Excellent placement of right 6 x 24 Contour stent proximal curl     upper pole, right kidney.  Distal curl urinary bladder.  INDICATION:  Denise [name] is a pleasant 63 year old lady with history of recurrent nephrolithiasis.  She was found on workup of occasional right flank and abdominal pain, recurring urinary tract infections, to have a right partial staghorn kidney stone.  Options were discussed for management including medical therapy versus percutaneous nephrostolithotomy versus ureteroscopic approach, and she wished to proceed with right percutaneous nephrostolithotomy.  Notably, the patient has chronic colonization  with Proteus, and she has been on perioperative antibiotics top reduce this colonization.  Informed consent was obtained and placed in medical record.  PROCEDURE IN DETAIL:  The patient being Denise [name] verified, procedure being right percutaneous nephrostolithotomy was confirmed.  Procedure was carried out.  Time-out was performed.  Intravenous antibiotics administered.  General endotracheal anesthesia was introduced.  The patient placed into a low lithotomy position.  Sterile field was created prepping and draping the patient's vagina, introitus, and proximal thighs using iodine x3.  Next, cystourethroscopy was performed.  A 22- French rigid cystoscope with 12-degree offset lens, inspection of the urinary bladder revealed no diverticula, calcifications, papillary lesions.  The right ureteral orifice was cannulated with a 6-French end- hole catheter and right retrograde pyelogram was obtained.  Right retrograde pyelogram demonstrates single right ureter with relatively bifid, single system right kidney.  There was a partial staghorn on the right upper pole without obvious hydroureteronephrosis. A 0.03 Glidewire was advanced at the level of the upper pole, over which a 6-French end-hole catheter was advanced such that the proximal end resided in upper pole infundibulum.  Foley catheter was placed per urethra to straight drain and the open-ended catheter was fashioned to this using silk tie.  Next, 2 iodinated contrast primed extension tubing, the patient was then completely repositioned  into a prone position applying prone view, 15 degrees stable flexion, axillary rolls, chest rolls, padding of her knees and ankles, sequential compression devices.  She was found to be suitably positioned.  New sterile field was created prepping and draping the patient's entire right flank using chlorhexidine gluconate.  Next, while using simultaneous retrograde pyelography and fluoroscopic exam is  suitable, upper pole calyx was found and identified for percutaneous access.  Engaging bull's-eye technique at 15 degrees off center and approach just above the 12th rib, an 18-gauge Chiba needle was used to cannulate this upper pole.  Efflux of urine was noted.  The 0.03 Glidewire was advanced down the level of the ureter to urinary bladder and exchanged for a Super Stiff wire via KMP catheter.  The skin incision was increased for a total distance of 2 cm.  Using coaxial dilator, a second Glidewire was advanced down to the level of urinary bladder and was exchanged for a second Super Stiff wire via the KMP catheter.  Next, percutaneous drape was applied.  One Super Stiff wire was set aside as a safety wire.  Next, 30-French NephroMax balloon dilation apparatus was carefully advanced across upper pole calyx, inflated to pressure of 16 atmospheres, held for 90 seconds, and a sheath was applied across this, and via this tract, rigid nephroscopy was performed.  Rigid nephroscopy revealed excellent sheath placed in direct apposition of the dominant upper pole stone.  Next, lithoclast dual ultrasound pneumatic energy was applied to the stone for approximately 2 hours fragmenting likely 90% of its volume and possibly accessible via rigid technique.  Next, flexible nephroscopy was performed using a 16-French flexible cystoscope and 2 acutely angled calices were identified which had residual stone.  One of these is amenable to simple basketing.  The other required holmium laser lithotripsy which was performed using a 200 nm fiber in the settings of 0.5 joules and 5 hertz fragmenting the stone into pieces approximately 8 mm or less which were then grasped and brought out in their entirety.  Final nephroscopic examination of each possible calyx via the upper pole tract revealed complete resolution of stone fragments larger than 1/3 mm.  Next, antegrade ureteroscopy was performed using 6-French  non-rigid ureteroscope that was passed under fluoroscopic vision over working wire at the level of urinary bladder. Complete ureteroscopic examination of the right ureter revealed no residual stone fragments.  No evidence of perforation.  Given these several findings, it was felt that stenting alone would be warranted, and no need for nephrostomy tube.  As such, a 6 x 24 Contour-type stent was placed in antegrade fashion using nephroscopic and fluoroscopic guidance.  Good proximal and distal curl was noted.  The sheath was removed and 10 mL of Surgiflo were applied along the tract.  The skin was reapproximated with interrupted Vicryl, and percutaneous dressing was applied.  Procedure was then terminated.  The patient tolerated the procedure well.  There were no immediate periprocedural complications. The patient was taken to the postanesthesia care in stable condition.          ______________________________ Alexis Frock, MD     TM/MEDQ  D:  03/18/2014  T:  03/18/2014  Job:  [id number]

## 2014-03-18 NOTE — Brief Op Note (Signed)
03/18/2014  3:44 PM  PATIENT:  Denise [name]  63 y.o. female  PRE-OPERATIVE DIAGNOSIS:  RIGHT STAGHORN STONE  POST-OPERATIVE DIAGNOSIS:  RIGHT STAGHORN STONE  PROCEDURE:  Procedure(s): NEPHROLITHOTOMY PERCUTANEOUS WITH ACCESS (Right) CYSTOSCOPY WITH RETROGRADE PYELOGRAM/URETEROSCOPY WITH URETERAL STENT PLACEMENT (Right) HOLMIUM LASER APPLICATION (Right)  SURGEON:  Surgeon(s) and Role:    * Alexis Frock, MD - Primary  PHYSICIAN ASSISTANT:   ASSISTANTS: none   ANESTHESIA:   general  EBL:  Total I/O In: 1000 [I.V.:1000] Out: 400 [Urine:400]  BLOOD ADMINISTERED:none  DRAINS: foley to straight drain   LOCAL MEDICATIONS USED:  NONE  SPECIMEN:  Source of Specimen:  Rt renal and ureteral stone fragments  DISPOSITION OF SPECIMEN:  [facility] for compositional analysis  COUNTS:  YES  TOURNIQUET:  * No tourniquets in log *  DICTATION: .Other Dictation: Dictation Number [id number]  PLAN OF CARE: Admit for overnight observation  PATIENT DISPOSITION:  PACU - hemodynamically stable.   Delay start of Pharmacological VTE agent (>24hrs) due to surgical blood loss or risk of bleeding: not applicable

## 2014-03-18 NOTE — Op Note (Signed)
NAME:  DONIELLE, KAIGLER                  ACCOUNT NO.:  [id number]  MEDICAL RECORD NO.:  [id number]  LOCATION:                               FACILITY:  [facility]  PHYSICIAN:  Alexis Frock, MD     DATE OF BIRTH:  12/13/1950  DATE OF PROCEDURE:  03/18/2014 DATE OF DISCHARGE:  03/22/2014                              OPERATIVE REPORT   DIAGNOSIS:  Recurrent tract infections, right partial staghorn kidney stone.  PROCEDURE: 1. Cystoscopy with right retrograde pyelogram and interpretation. 2. Right ureteral stent placement. 3. Right antegrade nephrostogram and interpretation. 4. Right ureteroscopy with laser lithotripsy. 5. Right percutaneous nephrostolithotomy stone greater than 2 cm. 6. Right percutaneous access to the kidney with dilation of tract.  ESTIMATED BLOOD LOSS:  100 mL.  COMPLICATIONS:  None.  SPECIMEN:  Right renal and ureteral stone fragments for compositional analysis.  FINDINGS: 1. Unremarkable urinary bladder. 2. Large filling defects in the upper pole, the right kidney     consistent with partial staghorn kidney stone on right retrograde     pyelogram. 3. Excellent access to the right upper pole kidney with through and     through access to the urinary bladder. 4. No excessive extravasation of contrast on final right antegrade     nephrostogram. 5. Excellent placement of right 6 x 24 Contour stent proximal curl     upper pole, right kidney.  Distal curl urinary bladder.  INDICATION:  Ms. Sannes is a pleasant 63 year old lady with history of recurrent nephrolithiasis.  She was found on workup of occasional right flank and abdominal pain, recurring urinary tract infections, to have a right partial staghorn kidney stone.  Options were discussed for management including medical therapy versus percutaneous nephrostolithotomy versus ureteroscopic approach, and she wished to proceed with right percutaneous nephrostolithotomy.  Notably, the patient has chronic colonization  with Proteus, and she has been on perioperative antibiotics top reduce this colonization.  Informed consent was obtained and placed in medical record.  PROCEDURE IN DETAIL:  The patient being Shariah Assad verified, procedure being right percutaneous nephrostolithotomy was confirmed.  Procedure was carried out.  Time-out was performed.  Intravenous antibiotics administered.  General endotracheal anesthesia was introduced.  The patient placed into a low lithotomy position.  Sterile field was created prepping and draping the patient's vagina, introitus, and proximal thighs using iodine x3.  Next, cystourethroscopy was performed.  A 22- French rigid cystoscope with 12-degree offset lens, inspection of the urinary bladder revealed no diverticula, calcifications, papillary lesions.  The right ureteral orifice was cannulated with a 6-French end- hole catheter and right retrograde pyelogram was obtained.  Right retrograde pyelogram demonstrates single right ureter with relatively bifid, single system right kidney.  There was a partial staghorn on the right upper pole without obvious hydroureteronephrosis. A 0.038 Glidewire was advanced at the level of the upper pole, over which a 6-French end-hole catheter was advanced such that the proximal end resided in upper pole infundibulum.  Foley catheter was placed per urethra to straight drain and the open-ended catheter was fashioned to this using silk tie.  Next,  iodinated contrast primed extension tubing, the patient was then completely repositioned  into a prone position applying prone view, 15 degrees stable flexion, axillary rolls, chest rolls, padding of her knees and ankles, sequential compression devices.  She was found to be suitably positioned.  New sterile field was created prepping and draping the patient's entire right flank using chlorhexidine gluconate.  Next, while using simultaneous retrograde pyelography and fluoroscopic exam, a  suitable upper pole calyx was found and identified for percutaneous access.  Using bull's-eye technique at 15 degrees off center and approach just above the 12th rib, an 18-gauge Chiba needle was used to cannulate this upper pole.  Efflux of urine was noted.  The 0.038 Glidewire was advanced down the level of the ureter to urinary bladder and exchanged for a Super Stiff wire via KMP catheter.  The skin incision was increased for a total distance of 2 cm.  Using coaxial dilator, a second Glidewire was advanced down to the level of urinary bladder and was exchanged for a second Super Stiff wire via the KMP catheter.  Next, percutaneous drape was applied.  One Super Stiff wire was set aside as a safety wire.  Next, 30-French NephroMax balloon dilation apparatus was carefully advanced across upper pole calyx, inflated to pressure of 16 atmospheres, held for 90 seconds, and a sheath was applied across this, and via this tract, rigid nephroscopy was performed.  Rigid nephroscopy revealed excellent sheath placed in direct apposition of the dominant upper pole stone.  Next, lithoclast dual ultrasound pneumatic energy was applied to the stone for approximately 2 hours fragmenting likely 90% of its volume and possibly accessible via rigid technique.  Next, flexible nephroscopy was performed using a 16-French flexible cystoscope and 2 acutely angled calices were identified which had residual stone.  One of these is amenable to simple basketing.  The other required holmium laser lithotripsy which was performed using a 200 nm fiber in the settings of 0.5 joules and 5 hertz fragmenting the stone into pieces approximately 8 mm or less which were then grasped and brought out in their entirety.  Final nephroscopic examination of each possible calyx via the upper pole tract revealed complete resolution of stone fragments larger than 1/3 mm.  Next, antegrade ureteroscopy was performed using 6-French  non-rigid ureteroscope that was passed under fluoroscopic vision over working wire at the level of urinary bladder. Complete ureteroscopic examination of the right ureter revealed no residual stone fragments.  No evidence of perforation.  Given these several findings, it was felt that stenting alone would be warranted, and no need for nephrostomy tube.  As such, a 6 x 24 Contour-type stent was placed in antegrade fashion using nephroscopic and fluoroscopic guidance.  Good proximal and distal curl was noted.  The sheath was removed and 10 mL of Surgiflo were applied along the tract.  The skin was reapproximated with interrupted Vicryl, and percutaneous dressing was applied.  Procedure was then terminated.  The patient tolerated the procedure well.  There were no immediate periprocedural complications. The patient was taken to the postanesthesia care in stable condition.          ______________________________ Alexis Frock, MD     TM/MEDQ  D:  03/18/2014  T:  03/18/2014  Job:  [id number]

## 2014-03-18 NOTE — Transfer of Care (Signed)
Immediate Anesthesia Transfer of Care Note  Patient: Denise [name]  Procedure(s) Performed: Procedure(s): NEPHROLITHOTOMY PERCUTANEOUS WITH ACCESS (Right) CYSTOSCOPY WITH RETROGRADE PYELOGRAM/URETEROSCOPY WITH URETERAL STENT PLACEMENT (Right) HOLMIUM LASER APPLICATION (Right)  Patient Location: PACU  Anesthesia Type:General  Level of Consciousness: awake, sedated and patient cooperative  Airway & Oxygen Therapy: Patient Spontanous Breathing and Patient connected to face mask oxygen  Post-op Assessment: Report given to PACU RN and Post -op Vital signs reviewed and stable  Post vital signs: Reviewed and stable  Complications: No apparent anesthesia complications

## 2014-03-19 ENCOUNTER — Observation Stay (HOSPITAL_COMMUNITY): Payer: BC Managed Care – PPO

## 2014-03-19 ENCOUNTER — Encounter (HOSPITAL_COMMUNITY): Payer: Self-pay | Admitting: Urology

## 2014-03-19 LAB — URINALYSIS, ROUTINE W REFLEX MICROSCOPIC
BILIRUBIN URINE: NEGATIVE
GLUCOSE, UA: NEGATIVE mg/dL
KETONES UR: NEGATIVE mg/dL
Nitrite: NEGATIVE
PROTEIN: 100 mg/dL — AB
Specific Gravity, Urine: 1.006 (ref 1.005–1.030)
Urobilinogen, UA: 1 mg/dL (ref 0.0–1.0)
pH: 7 (ref 5.0–8.0)

## 2014-03-19 LAB — GLUCOSE, CAPILLARY
GLUCOSE-CAPILLARY: 184 mg/dL — AB (ref 70–99)
GLUCOSE-CAPILLARY: 171 mg/dL — AB (ref 70–99)
GLUCOSE-CAPILLARY: 162 mg/dL — AB (ref 70–99)
Glucose-Capillary: 254 mg/dL — ABNORMAL HIGH (ref 70–99)
Glucose-Capillary: 172 mg/dL — ABNORMAL HIGH (ref 70–99)

## 2014-03-19 LAB — BASIC METABOLIC PANEL
ANION GAP: 10 (ref 5–15)
BUN: 8 mg/dL (ref 6–23)
CHLORIDE: 101 meq/L (ref 96–112)
CO2: 27 mEq/L (ref 19–32)
Calcium: 8.9 mg/dL (ref 8.4–10.5)
Creatinine, Ser: 0.64 mg/dL (ref 0.50–1.10)
GFR calc Af Amer: >90 mL/min (ref >90)
GFR calc non Af Amer: >90 mL/min (ref >90)
Glucose, Bld: 250 mg/dL — ABNORMAL HIGH (ref 70–99)
POTASSIUM: 4.4 meq/L (ref 3.7–5.3)
Sodium: 138 mEq/L (ref 137–147)

## 2014-03-19 LAB — HEMOGLOBIN AND HEMATOCRIT, BLOOD
HEMATOCRIT: 31 % — AB (ref 36.0–46.0)
Hemoglobin: 10 g/dL — ABNORMAL LOW (ref 12.0–15.0)

## 2014-03-19 LAB — URINE MICROSCOPIC-ADD ON

## 2014-03-19 MED ORDER — ACETAMINOPHEN 325 MG PO TABS
650.0000 mg | ORAL_TABLET | Freq: Four times a day (QID) | ORAL | Status: DC | PRN
  Administered 2014-03-19 – 2014-03-22 (×5): 650 mg via ORAL
  Filled 2014-03-19 (×5): qty 2

## 2014-03-19 MED ORDER — INSULIN ASPART 100 UNIT/ML ~~LOC~~ SOLN
3.0000 [IU] | Freq: Once | SUBCUTANEOUS | Status: AC
  Administered 2014-03-19: 3 [IU] via SUBCUTANEOUS

## 2014-03-19 MED ORDER — OXYBUTYNIN CHLORIDE 5 MG PO TABS
5.0000 mg | ORAL_TABLET | Freq: Three times a day (TID) | ORAL | Status: DC | PRN
  Administered 2014-03-19 – 2014-03-21 (×3): 5 mg via ORAL
  Filled 2014-03-19 (×2): qty 1

## 2014-03-19 MED ORDER — GENTAMICIN SULFATE 40 MG/ML IJ SOLN
420.0000 mg | INTRAVENOUS | Status: DC
  Administered 2014-03-19 – 2014-03-20 (×2): 420 mg via INTRAVENOUS
  Filled 2014-03-19 (×3): qty 10.5

## 2014-03-19 NOTE — Progress Notes (Signed)
1 Day Post-Op  Subjective:  1 - Rt Partial Staghorn Kidney Stone - s/p cysto, right retrograde, right percutaneous nephrostolithotomy with surgeon access, right ureteral stent placement 03/18/14 for upper pole partial staghorn stone. Access was between 11-12 ribs.   2 - Chronic Cystitis - Multiple episodes of simple cystitis. Most recent UCX's non-clonal or pan-sensitive e. Coli or proteus. Gent and Cipro peri-op.   Today Casi feels sore, and has slight headache, but otherwise w/o specific complaints. O2 sats intra and post-op mid 90s. Has h/o OSA on CPAP.   Objective: Vital signs in last 24 hours: Temp:  [97.4 F (36.3 C)-99.1 F (37.3 C)] 98.6 F (37 C) (07/02 0524) Pulse Rate:  [92-112] 92 (07/02 0524) Resp:  [16-34] 20 (07/02 0524) BP: (125-162)/(60-91) 133/70 mmHg (07/02 0524) SpO2:  [92 %-97 %] 95 % (07/02 0524) Weight:  [118.842 kg (262 lb)] 118.842 kg (262 lb) (07/01 1105) Last BM Date:  (PTA)  Intake/Output from previous day: 07/01 0701 - 07/02 0700 In: 3021.3 [P.O.:480; I.V.:2341.3; IV Piggyback:200] Out: 3000 [Urine:3000] Intake/Output this shift: Total I/O In: 792.5 [P.O.:480; I.V.:312.5] Out: 2400 [Urine:2400]  General appearance: alert, cooperative, appears stated age and husband at bedside as well Nose: Nares normal. Septum midline. Mucosa normal. No drainage or sinus tenderness., nasal CPAP in place, Sat's 95% Neck: supple, symmetrical, trachea midline Back: symmetric, no curvature. ROM normal. No CVA tenderness. Resp: wheezes slightly lower lungs, non-labored respirattion. No tachypnea Chest wall: no tenderness Cardio: Nl rate GI: soft, non-tender; bowel sounds normal; no masses,  no organomegaly Pelvic: external genitalia normal and foley c/d/i with clear urine Extremities: extremities normal, atraumatic, no cyanosis or edema Pulses: 2+ and symmetric Skin: Skin color, texture, turgor normal. No rashes or lesions Lymph nodes: Cervical, supraclavicular, and  axillary nodes normal. Neurologic: Grossly normal Incision/Wound: Rt flank site c/d/i with dry dressing.   Lab Results:   Recent Labs  03/16/14 1450 03/18/14 1636 03/19/14 0454  WBC 12.8*  --   --   HGB 12.1 11.5* 10.0*  HCT 37.4 36.0 31.0*  PLT 396  --   --    BMET  Recent Labs  03/18/14 1636 03/19/14 0454  NA 135* 138  K 4.2 4.4  CL 97 101  CO2 25 27  GLUCOSE 207* 250*  BUN 10 8  CREATININE 0.71 0.64  CALCIUM 9.1 8.9   PT/INR No results found for this basename: LABPROT, INR,  in the last 72 hours ABG No results found for this basename: PHART, PCO2, PO2, HCO3,  in the last 72 hours  Studies/Results: Dg Abd 1 View  03/18/2014   CLINICAL DATA:  Right stent placement. Right percutaneous nephrolithotomy.  EXAM: ABDOMEN - 1 VIEW; DG C-ARM GT 120 MIN - NRPT [facility]  COMPARISON:  CT, 02/04/14  FINDINGS: Two images show a catheter on the right. Contrast is seen within the bladder. There is a small amount of residual contrast in a lower pole calyx. The large stone and other smaller stones seen in the right kidney on the prior CT are not well defined on these images, but there are vague densities in the right abdomen that may reflect the stones or residual contrast.  IMPRESSION: Portable fluoroscopic imaging during right stent placement. Please refer to the procedure report for a complete description.   Electronically Signed   By: Lajean Manes M.D.   On: 03/18/2014 16:30   Dg Retrograde Pyelogram  03/18/2014   CLINICAL DATA:  History of stones  EXAM: RETROGRADE PYELOGRAM  COMPARISON:  None.  FINDINGS: Injection was performed on the right and reveals a staghorn calculus predominately within the upper pole.   Electronically Signed   By: Inez Catalina M.D.   On: 03/18/2014 13:58   Dg C-arm Gt 120 Min-no Report  03/18/2014   CLINICAL DATA:  Right stent placement. Right percutaneous nephrolithotomy.  EXAM: ABDOMEN - 1 VIEW; DG C-ARM GT 120 MIN - [facility]PT [facility]  COMPARISON:  CT, 02/04/14  FINDINGS:  Two images show a catheter on the right. Contrast is seen within the bladder. There is a small amount of residual contrast in a lower pole calyx. The large stone and other smaller stones seen in the right kidney on the prior CT are not well defined on these images, but there are vague densities in the right abdomen that may reflect the stones or residual contrast.  IMPRESSION: Portable fluoroscopic imaging during right stent placement. Please refer to the procedure report for a complete description.   Electronically Signed   By: Lajean Manes M.D.   On: 03/18/2014 16:30    Anti-infectives: Anti-infectives   Start     Dose/Rate Route Frequency Ordered Stop   03/18/14 1800  ciprofloxacin (CIPRO) IVPB 400 mg     400 mg 200 mL/hr over 60 Minutes Intravenous Every 12 hours 03/18/14 1739     03/18/14 0600  gentamicin (GARAMYCIN) 420 mg in dextrose 5 % 100 mL IVPB     420 mg 110.5 mL/hr over 60 Minutes Intravenous 30 min pre-op 03/17/14 1515 03/18/14 1345      Assessment/Plan:  1 - Rt Partial Staghorn Kidney Stone - Doing well POD 1. DC foley. CXR as some modest low sat's (though asymptomatic). Ambulate. Continue Cipro for now given long h/o urinary colonization with recurrent febrile UTI.   2 - Chronic Cystitis - Continue Cipro for now given long h/o urinary colonization with recurrent febrile UTI.   3 - Disposition - discussed goals for DC. Possible DC this afternoon.    [facility], Yolette Hastings 03/19/2014

## 2014-03-19 NOTE — Progress Notes (Signed)
Nutrition Brief Note  Patient identified on the Malnutrition Screening Tool (MST) Report  Wt Readings from Last 15 Encounters:  03/18/14 262 lb (118.842 kg)  03/18/14 262 lb (118.842 kg)  03/16/14 262 lb 2 oz (118.899 kg)  10/07/13 264 lb (119.75 kg)  11/26/12 261 lb 0.4 oz (118.4 kg)  03/19/12 265 lb 11.2 oz (120.521 kg)    Body mass index is 42.31 kg/(m^2). Patient meets criteria for Morbid Obesity/Obesity III based on current BMI.   Current diet order is Carb Modified, patient is consuming approximately 90% of meals at this time. Labs and medications reviewed.   Pt reported decreased appetite for one week pta d/t lethargy, weakness and nausea. Noted that appetite normally fluctuates, denied current nausea. Had not consumed lunch during RD visit, but planned on eating it at a later time. RN also noted pt with [employer] snack bars in room. Endorsed an non-significant weight loss (2-6 lbs, <2% body weight loss).  No nutrition interventions warranted at this time. If nutrition issues arise, please consult RD.   Atlee Abide MS RD LDN Clinical Dietitian [phone]

## 2014-03-19 NOTE — Progress Notes (Signed)
UR completed 

## 2014-03-19 NOTE — Progress Notes (Signed)
Pt currently on CPAP at 10 CMH20 per home settings.  Pt tolerating well at this time, RT to monitor and assess as needed.
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
## 2014-03-19 NOTE — Progress Notes (Signed)
Care of pt assumed. No change in initial AM assessment at this time. Continue with plan of care.

## 2014-03-20 DIAGNOSIS — I10 Essential (primary) hypertension: Secondary | ICD-10-CM | POA: Diagnosis present

## 2014-03-20 DIAGNOSIS — Z85828 Personal history of other malignant neoplasm of skin: Secondary | ICD-10-CM | POA: Diagnosis not present

## 2014-03-20 DIAGNOSIS — IMO0001 Reserved for inherently not codable concepts without codable children: Secondary | ICD-10-CM | POA: Diagnosis present

## 2014-03-20 DIAGNOSIS — Z881 Allergy status to other antibiotic agents status: Secondary | ICD-10-CM | POA: Diagnosis not present

## 2014-03-20 DIAGNOSIS — G4733 Obstructive sleep apnea (adult) (pediatric): Secondary | ICD-10-CM | POA: Diagnosis present

## 2014-03-20 DIAGNOSIS — N3020 Other chronic cystitis without hematuria: Secondary | ICD-10-CM | POA: Diagnosis present

## 2014-03-20 DIAGNOSIS — M1710 Unilateral primary osteoarthritis, unspecified knee: Secondary | ICD-10-CM | POA: Diagnosis present

## 2014-03-20 DIAGNOSIS — Z801 Family history of malignant neoplasm of trachea, bronchus and lung: Secondary | ICD-10-CM | POA: Diagnosis not present

## 2014-03-20 DIAGNOSIS — N3946 Mixed incontinence: Secondary | ICD-10-CM | POA: Diagnosis present

## 2014-03-20 DIAGNOSIS — Z9109 Other allergy status, other than to drugs and biological substances: Secondary | ICD-10-CM | POA: Diagnosis not present

## 2014-03-20 DIAGNOSIS — K219 Gastro-esophageal reflux disease without esophagitis: Secondary | ICD-10-CM | POA: Diagnosis present

## 2014-03-20 DIAGNOSIS — E119 Type 2 diabetes mellitus without complications: Secondary | ICD-10-CM | POA: Diagnosis present

## 2014-03-20 DIAGNOSIS — N3289 Other specified disorders of bladder: Secondary | ICD-10-CM | POA: Diagnosis present

## 2014-03-20 DIAGNOSIS — F411 Generalized anxiety disorder: Secondary | ICD-10-CM | POA: Diagnosis present

## 2014-03-20 DIAGNOSIS — Z87442 Personal history of urinary calculi: Secondary | ICD-10-CM | POA: Diagnosis not present

## 2014-03-20 DIAGNOSIS — Z87440 Personal history of urinary (tract) infections: Secondary | ICD-10-CM | POA: Diagnosis not present

## 2014-03-20 DIAGNOSIS — Z8673 Personal history of transient ischemic attack (TIA), and cerebral infarction without residual deficits: Secondary | ICD-10-CM | POA: Diagnosis not present

## 2014-03-20 DIAGNOSIS — N200 Calculus of kidney: Secondary | ICD-10-CM | POA: Diagnosis present

## 2014-03-20 DIAGNOSIS — N12 Tubulo-interstitial nephritis, not specified as acute or chronic: Secondary | ICD-10-CM | POA: Diagnosis present

## 2014-03-20 LAB — BASIC METABOLIC PANEL
Anion gap: 10 (ref 5–15)
BUN: 9 mg/dL (ref 6–23)
CO2: 27 mEq/L (ref 19–32)
Calcium: 8.7 mg/dL (ref 8.4–10.5)
Chloride: 100 mEq/L (ref 96–112)
Creatinine, Ser: 0.72 mg/dL (ref 0.50–1.10)
GFR calc Af Amer: >90 mL/min (ref >90)
GFR calc non Af Amer: 89 mL/min — ABNORMAL LOW (ref >90)
GLUCOSE: 175 mg/dL — AB (ref 70–99)
Potassium: 4.1 mEq/L (ref 3.7–5.3)
SODIUM: 137 meq/L (ref 137–147)

## 2014-03-20 LAB — URINE CULTURE
CULTURE: NO GROWTH
Colony Count: NO GROWTH
Special Requests: NORMAL

## 2014-03-20 LAB — GLUCOSE, CAPILLARY
GLUCOSE-CAPILLARY: 187 mg/dL — AB (ref 70–99)
Glucose-Capillary: 209 mg/dL — ABNORMAL HIGH (ref 70–99)
Glucose-Capillary: 205 mg/dL — ABNORMAL HIGH (ref 70–99)
Glucose-Capillary: 250 mg/dL — ABNORMAL HIGH (ref 70–99)

## 2014-03-20 NOTE — Progress Notes (Signed)
Pt states that she does not need help with cpap tonight. Encouraged pt to call with any questions.

## 2014-03-20 NOTE — Progress Notes (Signed)
Pt with temp 103.0. NP on call notified. Order given for Tylenol.  Will continue to monitor. Wendee Beavers [place]

## 2014-03-20 NOTE — Progress Notes (Signed)
2 Days Post-Op  Subjective:  1 - Rt Partial Staghorn Kidney Stone - s/p cysto, right retrograde, right percutaneous nephrostolithotomy with surgeon access, right ureteral stent placement 03/18/14 for upper pole partial staghorn stone. Access was between 11-12 ribs.   2 - Chronic Cystitis - Multiple episodes of simple cystitis. Most recent UCX's non-clonal or pan-sensitive e. Coli or proteus. Gent and Cipro peri-op.   Continues to have soreness at puncture site in right flank.  Complaining of dysuria.  Has had several spikes in her temperature.  Is now on cipro/gent while cultures return.  Has history of Proteus Mirabilis sensative to all but macrobid.  Objective: Vital signs in last 24 hours: Temp:  [98.9 F (37.2 C)-103 F (39.4 C)] 100.9 F (38.3 C) (07/03 0651) Pulse Rate:  [88-109] 88 (07/03 0502) Resp:  [18] 18 (07/03 0502) BP: (113-118)/(42-53) 118/53 mmHg (07/03 0502) SpO2:  [94 %-97 %] 97 % (07/03 0502) Last BM Date: 03/18/14  Intake/Output from previous day: 07/02 0701 - 07/03 0700 In: 1420 [P.O.:120; I.V.:900; IV Piggyback:400] Out: 2075 [Urine:2075] Intake/Output this shift: Total I/O In: -  Out: 400 [Urine:400]  NAD, wearing nasal CPAP apparatus  Rt flank site c/d/i with dry dressing.  Abdomen is soft No tubes/drains  Lab Results:   Recent Labs  03/18/14 1636 03/19/14 0454  HGB 11.5* 10.0*  HCT 36.0 31.0*   BMET  Recent Labs  03/19/14 0454 03/20/14 0433  NA 138 137  K 4.4 4.1  CL 101 100  CO2 27 27  GLUCOSE 250* 175*  BUN 8 9  CREATININE 0.64 0.72  CALCIUM 8.9 8.7   PT/INR No results found for this basename: LABPROT, INR,  in the last 72 hours ABG No results found for this basename: PHART, PCO2, PO2, HCO3,  in the last 72 hours  Studies/Results: Dg Abd 1 View  03/18/2014   CLINICAL DATA:  Right stent placement. Right percutaneous nephrolithotomy.  EXAM: ABDOMEN - 1 VIEW; DG C-ARM GT 120 MIN - NRPT [facility]  COMPARISON:  CT, 02/04/14  FINDINGS: Two  images show a catheter on the right. Contrast is seen within the bladder. There is a small amount of residual contrast in a lower pole calyx. The large stone and other smaller stones seen in the right kidney on the prior CT are not well defined on these images, but there are vague densities in the right abdomen that may reflect the stones or residual contrast.  IMPRESSION: Portable fluoroscopic imaging during right stent placement. Please refer to the procedure report for a complete description.   Electronically Signed   By: Lajean Manes M.D.   On: 03/18/2014 16:30   Dg Retrograde Pyelogram  03/18/2014   CLINICAL DATA:  History of stones  EXAM: RETROGRADE PYELOGRAM  COMPARISON:  None.  FINDINGS: Injection was performed on the right and reveals a staghorn calculus predominately within the upper pole.   Electronically Signed   By: Inez Catalina M.D.   On: 03/18/2014 13:58   Dg Chest Port 1 View  03/19/2014   CLINICAL DATA:  Cough congestion with shortness of breath. Desaturation status post percutaneous renal procedure  EXAM: PORTABLE CHEST - 1 VIEW  COMPARISON:  PA and lateral chest x-ray of March 19, 2014  FINDINGS: The lungs are well-expanded. The pulmonary interstitial markings are increased. The cardiac silhouette is enlarged but not greatly changed. The pulmonary vascularity is mildly prominent centrally but also stable. There may be a trace of pleural fluid on the left. The bony  thorax is unremarkable. The patient has undergone previous lower anterior cervical fusion.  IMPRESSION: The findings are consistent with mild pulmonary interstitial edema likely of cardiac cause. There is no pneumothorax or significant pleural effusion.   Electronically Signed   By: David  Martinique   On: 03/19/2014 08:21   Dg C-arm Gt 120 Min-no Report  03/18/2014   CLINICAL DATA:  Right stent placement. Right percutaneous nephrolithotomy.  EXAM: ABDOMEN - 1 VIEW; DG C-ARM GT 120 MIN - [facility]PT [facility]  COMPARISON:  CT, 02/04/14  FINDINGS:  Two images show a catheter on the right. Contrast is seen within the bladder. There is a small amount of residual contrast in a lower pole calyx. The large stone and other smaller stones seen in the right kidney on the prior CT are not well defined on these images, but there are vague densities in the right abdomen that may reflect the stones or residual contrast.  IMPRESSION: Portable fluoroscopic imaging during right stent placement. Please refer to the procedure report for a complete description.   Electronically Signed   By: Lajean Manes M.D.   On: 03/18/2014 16:30    Anti-infectives: Anti-infectives   Start     Dose/Rate Route Frequency Ordered Stop   03/19/14 1400  gentamicin (GARAMYCIN) 420 mg in dextrose 5 % 100 mL IVPB     420 mg 110.5 mL/hr over 60 Minutes Intravenous Every 24 hours 03/19/14 1210     03/18/14 1800  ciprofloxacin (CIPRO) IVPB 400 mg     400 mg 200 mL/hr over 60 Minutes Intravenous Every 12 hours 03/18/14 1739     03/18/14 0600  gentamicin (GARAMYCIN) 420 mg in dextrose 5 % 100 mL IVPB     420 mg 110.5 mL/hr over 60 Minutes Intravenous 30 min pre-op 03/17/14 1515 03/18/14 1345      Assessment/Plan:  1 - Rt Partial Staghorn Kidney Stone - likely developed pyelonephritis.  Cultures pending, but nothing to suggest bacteremia.  Continue Cipro/Gent.   Has history of Proteus Mirabilis UTIs sensative to all but macrobid.   2 - Chronic Cystitis -  As aboce  3 - Disposition - home once afebrile and on culture specific abx.  Ardis Hughs 03/20/2014

## 2014-03-21 LAB — CBC
HEMATOCRIT: 32.6 % — AB (ref 36.0–46.0)
Hemoglobin: 10.5 g/dL — ABNORMAL LOW (ref 12.0–15.0)
MCH: 29 pg (ref 26.0–34.0)
MCHC: 32.2 g/dL (ref 30.0–36.0)
MCV: 90.1 fL (ref 78.0–100.0)
Platelets: 301 10*3/uL (ref 150–400)
RBC: 3.62 MIL/uL — ABNORMAL LOW (ref 3.87–5.11)
RDW: 13.3 % (ref 11.5–15.5)
WBC: 8.7 10*3/uL (ref 4.0–10.5)

## 2014-03-21 LAB — BASIC METABOLIC PANEL
Anion gap: 9 (ref 5–15)
BUN: 9 mg/dL (ref 6–23)
CALCIUM: 9.4 mg/dL (ref 8.4–10.5)
CO2: 30 mEq/L (ref 19–32)
Chloride: 99 mEq/L (ref 96–112)
Creatinine, Ser: 0.78 mg/dL (ref 0.50–1.10)
GFR calc Af Amer: >90 mL/min (ref >90)
GFR calc non Af Amer: 87 mL/min — ABNORMAL LOW (ref >90)
GLUCOSE: 207 mg/dL — AB (ref 70–99)
Potassium: 4.3 mEq/L (ref 3.7–5.3)
SODIUM: 138 meq/L (ref 137–147)

## 2014-03-21 LAB — GLUCOSE, CAPILLARY
Glucose-Capillary: 219 mg/dL — ABNORMAL HIGH (ref 70–99)
Glucose-Capillary: 215 mg/dL — ABNORMAL HIGH (ref 70–99)
Glucose-Capillary: 192 mg/dL — ABNORMAL HIGH (ref 70–99)
Glucose-Capillary: 272 mg/dL — ABNORMAL HIGH (ref 70–99)

## 2014-03-21 LAB — GENTAMICIN LEVEL, RANDOM: Gentamicin Rm: 1.3 ug/mL

## 2014-03-21 MED ORDER — CIPROFLOXACIN HCL 500 MG PO TABS: 500.0000 mg | ORAL_TABLET | Freq: Two times a day (BID) | ORAL | Status: DC

## 2014-03-21 MED ORDER — CIPROFLOXACIN HCL 500 MG PO TABS
500.0000 mg | ORAL_TABLET | Freq: Two times a day (BID) | ORAL | Status: DC
  Administered 2014-03-21 – 2014-03-22 (×2): 500 mg via ORAL
  Filled 2014-03-21 (×4): qty 1

## 2014-03-21 NOTE — Progress Notes (Signed)
Pt states she does not need any assistance with CPAP or mask application. CPAP is set at [facility] per home settings and pt is using her home nasal pillows. Sterile water was added for humidification. Pt encouraged to call RT if she needs any assistance throughout the night.
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
## 2014-03-21 NOTE — Progress Notes (Addendum)
Patient ID: Denise [name], female   DOB: 07/19/1951, 63 y.o.   MRN: [id number]  Pt feeling better - asking to go home.   Filed Vitals:   03/21/14 0543  BP: 108/53  Pulse: 108  Temp: 98.8 F (37.1 C)  Resp: 18     PE: NAD Abd - soft, NT, no CVAT Ext - no calf swelling or pain    CBC    Component Value Date/Time   WBC 8.7 03/21/2014 0606   RBC 3.62* 03/21/2014 0606   HGB 10.5* 03/21/2014 0606   HCT 32.6* 03/21/2014 0606   PLT 301 03/21/2014 0606   MCV 90.1 03/21/2014 0606   MCH 29.0 03/21/2014 0606   MCHC 32.2 03/21/2014 0606   RDW 13.3 03/21/2014 0606   LYMPHSABS 2.6 02/11/2010 1145   MONOABS 0.6 02/11/2010 1145   EOSABS 0.2 02/11/2010 1145   BASOSABS 0.0 02/11/2010 1145    BMET    Component Value Date/Time   NA 138 03/21/2014 0606   K 4.3 03/21/2014 0606   CL 99 03/21/2014 0606   CO2 30 03/21/2014 0606   GLUCOSE 207* 03/21/2014 0606   BUN 9 03/21/2014 0606   CREATININE 0.78 03/21/2014 0606   CALCIUM 9.4 03/21/2014 0606   GFRNONAA 87* 03/21/2014 0606   GFRAA >90 03/21/2014 0606   Urine Cx negative Blood Cx's negative  -imp/plan  - POD#3 right URS, PCNL and ureteral stent placement with post-op pyelonephritis - pt feels better, tolerating reg diet, fever curve improved, Cx's neg to date.  -transition to po Cipro if remains stable tolerating po throughout afternoon will d/c home later on Cipro (last Cx in office + proteus sensitive to Cipro).    Addendum - 4:43 pm - pt feeling weak, wants to stay. Remains AFVSS. Continue po Cipro.

## 2014-03-21 NOTE — Discharge Instructions (Signed)
Ureteral Stent Implantation, Care After °Refer to this sheet in the next few weeks. These instructions provide you with information on caring for yourself after your procedure. Your health care provider may also give you more specific instructions. Your treatment has been planned according to current medical practices, but problems sometimes occur. Call your health care provider if you have any problems or questions after your procedure. °WHAT TO EXPECT AFTER THE PROCEDURE °You should be back to normal activity within 48 hours after the procedure. Nausea and vomiting may occur and are commonly the result of anesthesia. °It is common to experience sharp pain in the back or lower abdomen and penis with voiding. This is caused by movement of the ends of the stent with the act of urinating. It usually goes away within minutes after you have stopped urinating. °HOME CARE INSTRUCTIONS °Make sure to drink plenty of fluids. You may have small amounts of bleeding, causing your urine to be red. This is normal. Certain movements may trigger pain or a feeling that you need to urinate. You may be given medicines to prevent infection or bladder spasms. Be sure to take all medicines as directed. Only take over-the-counter or prescription medicines for pain, discomfort, or fever as directed by your health care provider. Do not take aspirin, as this can make bleeding worse. °Your stent will be left in until the blockage is resolved. This may take 2 weeks or longer, depending on the reason for stent implantation. You may have an X-ray exam to make sure your ureter is open and that the stent has not moved out of position (migrated). The stent can be removed by your health care provider in the office. Medicines may be given for comfort while the stent is being removed. Be sure to keep all follow-up appointments so your health care provider can check that you are healing properly. °SEEK MEDICAL CARE IF: °· You experience increasing  pain. °· Your pain medicine is not working. °SEEK IMMEDIATE MEDICAL CARE IF: °· Your urine is dark red or has blood clots. °· You are leaking urine (incontinent). °· You have a fever, chills, feeling sick to your stomach (nausea), or vomiting. °· Your pain is not relieved by pain medicine. °· The end of the stent comes out of the urethra. °· You are unable to urinate. °Document Released: 05/07/2013 Document Revised: 09/09/2013 Document Reviewed: 05/07/2013 °Exit[employer]® Patient Information ©2015 [employer]. This information is not intended to replace advice given to you by your health care provider. Make sure you discuss any questions you have with your health care provider. ° °

## 2014-03-22 LAB — BASIC METABOLIC PANEL
ANION GAP: 11 (ref 5–15)
BUN: 11 mg/dL (ref 6–23)
CHLORIDE: 96 meq/L (ref 96–112)
CO2: 31 mEq/L (ref 19–32)
Calcium: 9.5 mg/dL (ref 8.4–10.5)
Creatinine, Ser: 0.76 mg/dL (ref 0.50–1.10)
GFR calc non Af Amer: 88 mL/min — ABNORMAL LOW (ref >90)
Glucose, Bld: 214 mg/dL — ABNORMAL HIGH (ref 70–99)
POTASSIUM: 4.2 meq/L (ref 3.7–5.3)
SODIUM: 138 meq/L (ref 137–147)

## 2014-03-22 LAB — GLUCOSE, CAPILLARY: Glucose-Capillary: 171 mg/dL — ABNORMAL HIGH (ref 70–99)

## 2014-03-22 MED ORDER — OXYCODONE-ACETAMINOPHEN 5-325 MG PO TABS: 1.0000 | ORAL_TABLET | Freq: Four times a day (QID) | ORAL | Status: DC | PRN

## 2014-03-22 MED ORDER — OXYBUTYNIN CHLORIDE 5 MG PO TABS: 5.0000 mg | ORAL_TABLET | Freq: Three times a day (TID) | ORAL | Status: AC | PRN

## 2014-03-22 NOTE — Discharge Summary (Signed)
Physician Discharge Summary  Patient ID: Denise [name] MRN: [id number] DOB/AGE: 25-May-1951 63 y.o.  Admit date: 03/18/2014 Discharge date: 03/22/2014  Admission Diagnoses: Right staghorn calculus  Discharge Diagnoses:  Active Problems:   Staghorn calculus   Discharged Condition: good  Hospital Course: Patient admitted following right PCNL and right ureteroscopy. She was maintained with right ureteral stent nephrostomy tube. He did well postoperative day #1 developed intermittent spiking temperatures up to 103 consistent with pyelonephritis. She was started on IV Cipro and gentamicin based on her past culture of Proteus. Her urine and blood cultures ended up being negative. Postop day #3 her fever curve was improving and she was switched to by mouth Cipro. Postop day #4 she is afebrile with stable vital signs. She looks and feels better. His tolerating a regular diet. She is felt stable for discharge on by mouth Cipro. She is having some bladder spasms which responded well to oxybutynin.  Consults: None  Significant Diagnostic Studies: none  Treatments: surgery:  1. Cystoscopy with right retrograde pyelogram and interpretation.  2. Right ureteral stent placement.  3. Right antegrade nephrostogram and interpretation.  4. Right ureteroscopy with laser lithotripsy.  5. Right percutaneous nephrostolithotomy stone greater than 2 cm.  6. Right percutaneous access to the kidney with dilation of tract.   Discharge Exam: Blood pressure 101/62, pulse 87, temperature 98.2 F (36.8 C), temperature source Oral, resp. rate 20, height 5\' 6"  (1.676 m), weight 118.842 kg (262 lb), SpO2 95.00%. Sitting on edge of bed in no acute distress   right flank dressing is clean and dry   Disposition: 01-Home or Self Care     Medication List    STOP taking these medications       nitrofurantoin 100 MG capsule  Commonly known as:  MACRODANTIN      TAKE these medications       ACCUPRIL 20 MG tablet   Generic drug:  quinapril  Take 20 mg by mouth every morning.     ALEVE PM 220-25 MG Tabs  Generic drug:  Naproxen Sod-Diphenhydramine  Take 2 tablets by mouth at bedtime as needed (FOR PAIN/SLEEP).     amLODipine 5 MG tablet  Commonly known as:  NORVASC  Take 5 mg by mouth every morning.     Biotin 5000 MCG Caps  Take 1 capsule by mouth daily.     ciprofloxacin 500 MG tablet  Commonly known as:  CIPRO  Take 1 tablet (500 mg total) by mouth 2 (two) times daily.     dextromethorphan-guaiFENesin 30-600 MG per 12 hr tablet  Commonly known as:  MUCINEX DM  Take 1 tablet by mouth 2 (two) times daily as needed for cough (CONGESTION).     loratadine 10 MG tablet  Commonly known as:  CLARITIN  Take 10 mg by mouth daily as needed. allergies     metFORMIN 1000 MG tablet  Commonly known as:  GLUCOPHAGE  Take 1,000 mg by mouth 2 (two) times daily with a meal.     multivitamin with minerals Tabs tablet  Take 1 tablet by mouth daily.     MYRBETRIQ 50 MG Tb24 tablet  Generic drug:  mirabegron ER  Take 50 mg by mouth daily.     naproxen sodium 220 MG tablet  Commonly known as:  ANAPROX  Take 440 mg by mouth 2 (two) times daily as needed (FOR PAIN).     NASACORT ALLERGY 24HR 55 MCG/ACT Aero nasal inhaler  Generic drug:  triamcinolone  Place 2 sprays into the nose daily.     oxybutynin 5 MG tablet  Commonly known as:  DITROPAN  Take 1 tablet (5 mg total) by mouth every 8 (eight) hours as needed for bladder spasms.     oxyCODONE-acetaminophen 5-325 MG per tablet  Commonly known as:  ROXICET  Take 1-2 tablets by mouth every 6 (six) hours as needed for severe pain.     pravastatin 20 MG tablet  Commonly known as:  PRAVACHOL  Take 20 mg by mouth daily.     TRULICITY 1.5 JA/2.5KN Sopn  Generic drug:  Dulaglutide  Inject 1.5 mg into the skin once a week.           Follow-up Information   Follow up with Alexis Frock, MD On 03/18/2014. (call to verify appointment. )     Specialty:  Urology   Contact information:   [address] [place] [address] [phone]       Signed: Festus Aloe 03/22/2014, 11:02 AM

## 2014-03-22 NOTE — Progress Notes (Signed)
Urine remains pink/blood tinged, QS, no stones noted when strained.  Continues to have pain/stinging while voiding  of 5 out of 10.  Ditropan and prns given.  Pt would like to have an RX for Ditropan upon DC for home prn please.  No stool since 7-1, is passing gas and tolerating diet, Scheduled senna given and elimination education provided and pt verbalizes understanding.

## 2014-03-25 LAB — CULTURE, BLOOD (ROUTINE X 2)
CULTURE: NO GROWTH
Culture: NO GROWTH
SPECIAL REQUESTS: NORMAL
Special Requests: NORMAL

## 2015-04-30 DIAGNOSIS — E782 Mixed hyperlipidemia: Secondary | ICD-10-CM | POA: Diagnosis not present

## 2015-04-30 DIAGNOSIS — E559 Vitamin D deficiency, unspecified: Secondary | ICD-10-CM | POA: Diagnosis not present

## 2015-04-30 DIAGNOSIS — E1165 Type 2 diabetes mellitus with hyperglycemia: Secondary | ICD-10-CM | POA: Diagnosis not present

## 2015-04-30 DIAGNOSIS — I10 Essential (primary) hypertension: Secondary | ICD-10-CM | POA: Diagnosis not present

## 2015-04-30 DIAGNOSIS — E1129 Type 2 diabetes mellitus with other diabetic kidney complication: Secondary | ICD-10-CM | POA: Diagnosis not present

## 2015-05-25 DIAGNOSIS — J329 Chronic sinusitis, unspecified: Secondary | ICD-10-CM | POA: Diagnosis not present

## 2015-07-09 DIAGNOSIS — Z23 Encounter for immunization: Secondary | ICD-10-CM | POA: Diagnosis not present

## 2015-07-09 DIAGNOSIS — I10 Essential (primary) hypertension: Secondary | ICD-10-CM | POA: Diagnosis not present

## 2015-07-09 DIAGNOSIS — E1129 Type 2 diabetes mellitus with other diabetic kidney complication: Secondary | ICD-10-CM | POA: Diagnosis not present

## 2015-07-09 DIAGNOSIS — E559 Vitamin D deficiency, unspecified: Secondary | ICD-10-CM | POA: Diagnosis not present

## 2015-07-09 DIAGNOSIS — E1165 Type 2 diabetes mellitus with hyperglycemia: Secondary | ICD-10-CM | POA: Diagnosis not present

## 2015-07-09 DIAGNOSIS — E782 Mixed hyperlipidemia: Secondary | ICD-10-CM | POA: Diagnosis not present

## 2015-08-24 ENCOUNTER — Other Ambulatory Visit: Payer: Self-pay | Admitting: Sports Medicine

## 2015-08-24 DIAGNOSIS — M545 Low back pain: Secondary | ICD-10-CM | POA: Diagnosis not present

## 2015-08-24 DIAGNOSIS — M5440 Lumbago with sciatica, unspecified side: Secondary | ICD-10-CM

## 2015-09-04 ENCOUNTER — Ambulatory Visit
Admission: RE | Admit: 2015-09-04 | Discharge: 2015-09-04 | Disposition: A | Discharge status: Home / Self Care | Payer: BC Managed Care – PPO | Source: Ambulatory Visit | Attending: Sports Medicine | Admitting: Sports Medicine

## 2015-09-04 DIAGNOSIS — M5440 Lumbago with sciatica, unspecified side: Secondary | ICD-10-CM

## 2015-09-04 DIAGNOSIS — M5126 Other intervertebral disc displacement, lumbar region: Secondary | ICD-10-CM | POA: Diagnosis not present

## 2015-09-10 DIAGNOSIS — E1129 Type 2 diabetes mellitus with other diabetic kidney complication: Secondary | ICD-10-CM | POA: Diagnosis not present

## 2015-09-10 DIAGNOSIS — E1165 Type 2 diabetes mellitus with hyperglycemia: Secondary | ICD-10-CM | POA: Diagnosis not present

## 2015-09-10 DIAGNOSIS — E782 Mixed hyperlipidemia: Secondary | ICD-10-CM | POA: Diagnosis not present

## 2015-09-10 DIAGNOSIS — E559 Vitamin D deficiency, unspecified: Secondary | ICD-10-CM | POA: Diagnosis not present

## 2015-09-10 DIAGNOSIS — I10 Essential (primary) hypertension: Secondary | ICD-10-CM | POA: Diagnosis not present

## 2015-09-14 DIAGNOSIS — M545 Low back pain: Secondary | ICD-10-CM | POA: Diagnosis not present

## 2015-11-01 DIAGNOSIS — M5126 Other intervertebral disc displacement, lumbar region: Secondary | ICD-10-CM | POA: Diagnosis not present

## 2015-11-01 DIAGNOSIS — M5416 Radiculopathy, lumbar region: Secondary | ICD-10-CM | POA: Diagnosis not present

## 2015-11-01 DIAGNOSIS — M4126 Other idiopathic scoliosis, lumbar region: Secondary | ICD-10-CM | POA: Diagnosis not present

## 2015-11-01 DIAGNOSIS — M25562 Pain in left knee: Secondary | ICD-10-CM | POA: Diagnosis not present

## 2015-11-01 DIAGNOSIS — G8929 Other chronic pain: Secondary | ICD-10-CM | POA: Diagnosis not present

## 2015-11-01 DIAGNOSIS — Z6841 Body Mass Index (BMI) 40.0 and over, adult: Secondary | ICD-10-CM | POA: Diagnosis not present

## 2015-11-01 DIAGNOSIS — M545 Low back pain: Secondary | ICD-10-CM | POA: Diagnosis not present

## 2015-12-16 DIAGNOSIS — E782 Mixed hyperlipidemia: Secondary | ICD-10-CM | POA: Diagnosis not present

## 2015-12-16 DIAGNOSIS — E559 Vitamin D deficiency, unspecified: Secondary | ICD-10-CM | POA: Diagnosis not present

## 2015-12-16 DIAGNOSIS — I10 Essential (primary) hypertension: Secondary | ICD-10-CM | POA: Diagnosis not present

## 2015-12-16 DIAGNOSIS — M5126 Other intervertebral disc displacement, lumbar region: Secondary | ICD-10-CM | POA: Diagnosis not present

## 2015-12-16 DIAGNOSIS — M545 Low back pain: Secondary | ICD-10-CM | POA: Diagnosis not present

## 2015-12-16 DIAGNOSIS — E1129 Type 2 diabetes mellitus with other diabetic kidney complication: Secondary | ICD-10-CM | POA: Diagnosis not present

## 2015-12-16 DIAGNOSIS — M5416 Radiculopathy, lumbar region: Secondary | ICD-10-CM | POA: Diagnosis not present

## 2015-12-16 DIAGNOSIS — E1165 Type 2 diabetes mellitus with hyperglycemia: Secondary | ICD-10-CM | POA: Diagnosis not present

## 2016-01-06 DIAGNOSIS — M545 Low back pain: Secondary | ICD-10-CM | POA: Diagnosis not present

## 2016-01-06 DIAGNOSIS — M5126 Other intervertebral disc displacement, lumbar region: Secondary | ICD-10-CM | POA: Diagnosis not present

## 2016-02-08 DIAGNOSIS — R911 Solitary pulmonary nodule: Secondary | ICD-10-CM | POA: Diagnosis not present

## 2016-02-08 DIAGNOSIS — N3020 Other chronic cystitis without hematuria: Secondary | ICD-10-CM | POA: Diagnosis not present

## 2016-02-08 DIAGNOSIS — R350 Frequency of micturition: Secondary | ICD-10-CM | POA: Diagnosis not present

## 2016-02-08 DIAGNOSIS — N200 Calculus of kidney: Secondary | ICD-10-CM | POA: Diagnosis not present

## 2016-02-08 DIAGNOSIS — N3946 Mixed incontinence: Secondary | ICD-10-CM | POA: Diagnosis not present

## 2016-03-01 DIAGNOSIS — Z13820 Encounter for screening for osteoporosis: Secondary | ICD-10-CM | POA: Diagnosis not present

## 2016-03-01 DIAGNOSIS — Z01419 Encounter for gynecological examination (general) (routine) without abnormal findings: Secondary | ICD-10-CM | POA: Diagnosis not present

## 2016-03-01 DIAGNOSIS — Z6841 Body Mass Index (BMI) 40.0 and over, adult: Secondary | ICD-10-CM | POA: Diagnosis not present

## 2016-03-01 DIAGNOSIS — Z1231 Encounter for screening mammogram for malignant neoplasm of breast: Secondary | ICD-10-CM | POA: Diagnosis not present

## 2016-05-24 DIAGNOSIS — E1129 Type 2 diabetes mellitus with other diabetic kidney complication: Secondary | ICD-10-CM | POA: Diagnosis not present

## 2016-05-24 DIAGNOSIS — E1165 Type 2 diabetes mellitus with hyperglycemia: Secondary | ICD-10-CM | POA: Diagnosis not present

## 2016-05-24 DIAGNOSIS — E782 Mixed hyperlipidemia: Secondary | ICD-10-CM | POA: Diagnosis not present

## 2016-05-24 DIAGNOSIS — E559 Vitamin D deficiency, unspecified: Secondary | ICD-10-CM | POA: Diagnosis not present

## 2016-05-24 DIAGNOSIS — I10 Essential (primary) hypertension: Secondary | ICD-10-CM | POA: Diagnosis not present

## 2016-10-09 DIAGNOSIS — E559 Vitamin D deficiency, unspecified: Secondary | ICD-10-CM | POA: Diagnosis not present

## 2016-10-09 DIAGNOSIS — E1165 Type 2 diabetes mellitus with hyperglycemia: Secondary | ICD-10-CM | POA: Diagnosis not present

## 2016-10-09 DIAGNOSIS — E782 Mixed hyperlipidemia: Secondary | ICD-10-CM | POA: Diagnosis not present

## 2016-10-16 DIAGNOSIS — E559 Vitamin D deficiency, unspecified: Secondary | ICD-10-CM | POA: Diagnosis not present

## 2016-10-16 DIAGNOSIS — E1165 Type 2 diabetes mellitus with hyperglycemia: Secondary | ICD-10-CM | POA: Diagnosis not present

## 2016-10-16 DIAGNOSIS — I10 Essential (primary) hypertension: Secondary | ICD-10-CM | POA: Diagnosis not present

## 2016-10-16 DIAGNOSIS — Z9989 Dependence on other enabling machines and devices: Secondary | ICD-10-CM | POA: Diagnosis not present

## 2016-10-16 DIAGNOSIS — Z6841 Body Mass Index (BMI) 40.0 and over, adult: Secondary | ICD-10-CM | POA: Diagnosis not present

## 2016-10-16 DIAGNOSIS — E669 Obesity, unspecified: Secondary | ICD-10-CM | POA: Diagnosis not present

## 2016-10-16 DIAGNOSIS — E1129 Type 2 diabetes mellitus with other diabetic kidney complication: Secondary | ICD-10-CM | POA: Diagnosis not present

## 2016-10-16 DIAGNOSIS — G4733 Obstructive sleep apnea (adult) (pediatric): Secondary | ICD-10-CM | POA: Diagnosis not present

## 2016-10-16 DIAGNOSIS — E782 Mixed hyperlipidemia: Secondary | ICD-10-CM | POA: Diagnosis not present

## 2016-10-16 DIAGNOSIS — Z794 Long term (current) use of insulin: Secondary | ICD-10-CM | POA: Diagnosis not present

## 2016-12-11 ENCOUNTER — Observation Stay (HOSPITAL_COMMUNITY)
Admission: EM | Admit: 2016-12-11 | Discharge: 2016-12-12 | Disposition: A | Discharge status: Home / Self Care | Payer: BC Managed Care – PPO | Attending: Family Medicine | Admitting: Family Medicine

## 2016-12-11 ENCOUNTER — Emergency Department (HOSPITAL_COMMUNITY): Payer: BC Managed Care – PPO

## 2016-12-11 ENCOUNTER — Encounter (HOSPITAL_COMMUNITY): Payer: Self-pay

## 2016-12-11 DIAGNOSIS — R079 Chest pain, unspecified: Secondary | ICD-10-CM | POA: Diagnosis not present

## 2016-12-11 DIAGNOSIS — E669 Obesity, unspecified: Secondary | ICD-10-CM | POA: Diagnosis present

## 2016-12-11 DIAGNOSIS — Z791 Long term (current) use of non-steroidal anti-inflammatories (NSAID): Secondary | ICD-10-CM | POA: Diagnosis not present

## 2016-12-11 DIAGNOSIS — R0789 Other chest pain: Secondary | ICD-10-CM | POA: Diagnosis not present

## 2016-12-11 DIAGNOSIS — R0602 Shortness of breath: Secondary | ICD-10-CM | POA: Diagnosis not present

## 2016-12-11 DIAGNOSIS — E119 Type 2 diabetes mellitus without complications: Secondary | ICD-10-CM | POA: Diagnosis not present

## 2016-12-11 DIAGNOSIS — E785 Hyperlipidemia, unspecified: Secondary | ICD-10-CM | POA: Diagnosis not present

## 2016-12-11 DIAGNOSIS — J181 Lobar pneumonia, unspecified organism: Secondary | ICD-10-CM

## 2016-12-11 DIAGNOSIS — Z6841 Body Mass Index (BMI) 40.0 and over, adult: Secondary | ICD-10-CM | POA: Insufficient documentation

## 2016-12-11 DIAGNOSIS — E1165 Type 2 diabetes mellitus with hyperglycemia: Secondary | ICD-10-CM | POA: Diagnosis not present

## 2016-12-11 DIAGNOSIS — Z79899 Other long term (current) drug therapy: Secondary | ICD-10-CM | POA: Diagnosis not present

## 2016-12-11 DIAGNOSIS — J189 Pneumonia, unspecified organism: Principal | ICD-10-CM | POA: Diagnosis present

## 2016-12-11 DIAGNOSIS — N3281 Overactive bladder: Secondary | ICD-10-CM | POA: Diagnosis not present

## 2016-12-11 DIAGNOSIS — R000 Tachycardia, unspecified: Secondary | ICD-10-CM | POA: Insufficient documentation

## 2016-12-11 DIAGNOSIS — J9601 Acute respiratory failure with hypoxia: Secondary | ICD-10-CM

## 2016-12-11 DIAGNOSIS — R0902 Hypoxemia: Secondary | ICD-10-CM | POA: Diagnosis not present

## 2016-12-11 DIAGNOSIS — I10 Essential (primary) hypertension: Secondary | ICD-10-CM

## 2016-12-11 DIAGNOSIS — K219 Gastro-esophageal reflux disease without esophagitis: Secondary | ICD-10-CM | POA: Diagnosis not present

## 2016-12-11 DIAGNOSIS — R509 Fever, unspecified: Secondary | ICD-10-CM | POA: Diagnosis not present

## 2016-12-11 DIAGNOSIS — E559 Vitamin D deficiency, unspecified: Secondary | ICD-10-CM | POA: Diagnosis not present

## 2016-12-11 DIAGNOSIS — Z96659 Presence of unspecified artificial knee joint: Secondary | ICD-10-CM | POA: Diagnosis not present

## 2016-12-11 DIAGNOSIS — Z7984 Long term (current) use of oral hypoglycemic drugs: Secondary | ICD-10-CM | POA: Diagnosis not present

## 2016-12-11 DIAGNOSIS — R05 Cough: Secondary | ICD-10-CM | POA: Diagnosis present

## 2016-12-11 DIAGNOSIS — G4733 Obstructive sleep apnea (adult) (pediatric): Secondary | ICD-10-CM | POA: Diagnosis not present

## 2016-12-11 LAB — I-STAT TROPONIN, ED: TROPONIN I, POC: 0 ng/mL (ref 0.00–0.08)

## 2016-12-11 LAB — CBC WITH DIFFERENTIAL/PLATELET
Basophils Absolute: 0.1 10*3/uL (ref 0.0–0.1)
Basophils Relative: 1 %
Eosinophils Absolute: 0.1 10*3/uL (ref 0.0–0.7)
Eosinophils Relative: 1 %
HEMATOCRIT: 48.3 % — AB (ref 36.0–46.0)
HEMOGLOBIN: 16.4 g/dL — AB (ref 12.0–15.0)
LYMPHS PCT: 22 %
Lymphs Abs: 2.3 10*3/uL (ref 0.7–4.0)
MCH: 31.7 pg (ref 26.0–34.0)
MCHC: 34 g/dL (ref 30.0–36.0)
MCV: 93.4 fL (ref 78.0–100.0)
MONO ABS: 0.8 10*3/uL (ref 0.1–1.0)
Monocytes Relative: 8 %
NEUTROS ABS: 7.5 10*3/uL (ref 1.7–7.7)
NEUTROS PCT: 68 %
Platelets: 243 10*3/uL (ref 150–400)
RBC: 5.17 MIL/uL — ABNORMAL HIGH (ref 3.87–5.11)
RDW: 13.4 % (ref 11.5–15.5)
WBC: 10.8 10*3/uL — ABNORMAL HIGH (ref 4.0–10.5)

## 2016-12-11 LAB — I-STAT VENOUS BLOOD GAS, ED
Acid-Base Excess: 1 mmol/L (ref 0.0–2.0)
BICARBONATE: 27.7 mmol/L (ref 20.0–28.0)
O2 Saturation: 52 %
PCO2 VEN: 48.1 mmHg (ref 44.0–60.0)
TCO2: 29 mmol/L (ref 0–100)
pH, Ven: 7.368 (ref 7.250–7.430)
pO2, Ven: 29 mmHg — CL (ref 32.0–45.0)

## 2016-12-11 LAB — CBG MONITORING, ED: Glucose-Capillary: 248 mg/dL — ABNORMAL HIGH (ref 65–99)

## 2016-12-11 LAB — GLUCOSE, CAPILLARY: Glucose-Capillary: 189 mg/dL — ABNORMAL HIGH (ref 65–99)

## 2016-12-11 LAB — TROPONIN I: Troponin I: <0.03 ng/mL (ref <0.03)

## 2016-12-11 LAB — I-STAT CG4 LACTIC ACID, ED: Lactic Acid, Venous: 1.51 mmol/L (ref 0.5–1.9)

## 2016-12-11 LAB — CREATININE, SERUM
CREATININE: 0.82 mg/dL (ref 0.44–1.00)
GFR calc Af Amer: >60 mL/min (ref >60)

## 2016-12-11 LAB — TSH: TSH: 2.021 u[IU]/mL (ref 0.350–4.500)

## 2016-12-11 MED ORDER — NAPROXEN SODIUM 550 MG PO TABS
550.0000 mg | ORAL_TABLET | Freq: Every evening | ORAL | Status: DC | PRN
  Filled 2016-12-11: qty 1

## 2016-12-11 MED ORDER — AMLODIPINE BESYLATE 5 MG PO TABS: 5.0000 mg | ORAL_TABLET | Freq: Every morning | ORAL | Status: DC

## 2016-12-11 MED ORDER — DM-GUAIFENESIN ER 30-600 MG PO TB12
1.0000 | ORAL_TABLET | Freq: Once | ORAL | Status: AC
  Administered 2016-12-11: 1 via ORAL
  Filled 2016-12-11: qty 1

## 2016-12-11 MED ORDER — ALBUTEROL SULFATE HFA 108 (90 BASE) MCG/ACT IN AERS
2.0000 | INHALATION_SPRAY | Freq: Once | RESPIRATORY_TRACT | Status: AC
  Administered 2016-12-11: 2 via RESPIRATORY_TRACT
  Filled 2016-12-11: qty 6.7

## 2016-12-11 MED ORDER — OXYBUTYNIN CHLORIDE 5 MG PO TABS: 5.0000 mg | ORAL_TABLET | Freq: Three times a day (TID) | ORAL | Status: DC | PRN

## 2016-12-11 MED ORDER — LEVOFLOXACIN 750 MG PO TABS: 750.0000 mg | ORAL_TABLET | Freq: Every day | ORAL | Status: DC

## 2016-12-11 MED ORDER — LORATADINE 10 MG PO TABS
10.0000 mg | ORAL_TABLET | Freq: Every day | ORAL | Status: DC | PRN
Start: 2016-12-11 — End: 2016-12-12
  Filled 2016-12-11: qty 1

## 2016-12-11 MED ORDER — ACETAMINOPHEN 650 MG RE SUPP: 650.0000 mg | Freq: Four times a day (QID) | RECTAL | Status: DC | PRN

## 2016-12-11 MED ORDER — ACETAMINOPHEN 500 MG PO TABS
1000.0000 mg | ORAL_TABLET | Freq: Once | ORAL | Status: AC
  Administered 2016-12-11: 1000 mg via ORAL
  Filled 2016-12-11: qty 2

## 2016-12-11 MED ORDER — ACETAMINOPHEN 325 MG PO TABS
650.0000 mg | ORAL_TABLET | Freq: Four times a day (QID) | ORAL | Status: DC | PRN
  Administered 2016-12-12 (×3): 650 mg via ORAL
  Filled 2016-12-11 (×3): qty 2

## 2016-12-11 MED ORDER — SODIUM CHLORIDE 0.9% FLUSH
3.0000 mL | Freq: Two times a day (BID) | INTRAVENOUS | Status: DC
  Administered 2016-12-11 – 2016-12-12 (×2): 3 mL via INTRAVENOUS

## 2016-12-11 MED ORDER — NAPROXEN SOD-DIPHENHYDRAMINE 220-25 MG PO TABS: 2.0000 | ORAL_TABLET | Freq: Every evening | ORAL | Status: DC | PRN

## 2016-12-11 MED ORDER — DM-GUAIFENESIN ER 30-600 MG PO TB12
1.0000 | ORAL_TABLET | Freq: Two times a day (BID) | ORAL | Status: DC | PRN
  Administered 2016-12-12: 1 via ORAL

## 2016-12-11 MED ORDER — ASPIRIN EC 81 MG PO TBEC
81.0000 mg | DELAYED_RELEASE_TABLET | Freq: Every day | ORAL | Status: DC
  Administered 2016-12-12: 81 mg via ORAL
  Filled 2016-12-11 (×2): qty 1

## 2016-12-11 MED ORDER — ENOXAPARIN SODIUM 40 MG/0.4ML ~~LOC~~ SOLN
40.0000 mg | SUBCUTANEOUS | Status: DC
  Administered 2016-12-11: 40 mg via SUBCUTANEOUS
  Filled 2016-12-11: qty 0.4

## 2016-12-11 MED ORDER — OXYCODONE-ACETAMINOPHEN 5-325 MG PO TABS: 1.0000 | ORAL_TABLET | Freq: Four times a day (QID) | ORAL | Status: DC | PRN

## 2016-12-11 MED ORDER — INSULIN ASPART 100 UNIT/ML ~~LOC~~ SOLN
0.0000 [IU] | Freq: Every day | SUBCUTANEOUS | Status: DC
Start: 2016-12-11 — End: 2016-12-12

## 2016-12-11 MED ORDER — DM-GUAIFENESIN ER 30-600 MG PO TB12
1.0000 | ORAL_TABLET | Freq: Two times a day (BID) | ORAL | Status: DC
Start: 2016-12-11 — End: 2016-12-11

## 2016-12-11 MED ORDER — QUINAPRIL HCL 10 MG PO TABS: 20.0000 mg | ORAL_TABLET | Freq: Every morning | ORAL | Status: DC

## 2016-12-11 MED ORDER — TRIAMCINOLONE ACETONIDE 55 MCG/ACT NA AERO: 2.0000 | INHALATION_SPRAY | Freq: Every day | NASAL | Status: DC

## 2016-12-11 MED ORDER — INSULIN ASPART 100 UNIT/ML ~~LOC~~ SOLN
0.0000 [IU] | Freq: Three times a day (TID) | SUBCUTANEOUS | Status: DC
  Administered 2016-12-12 (×2): 2 [IU] via SUBCUTANEOUS

## 2016-12-11 MED ORDER — DIPHENHYDRAMINE HCL 25 MG PO CAPS
50.0000 mg | ORAL_CAPSULE | Freq: Every evening | ORAL | Status: DC | PRN
  Administered 2016-12-11: 50 mg via ORAL
  Filled 2016-12-11: qty 2

## 2016-12-11 MED ORDER — NITROGLYCERIN 0.4 MG SL SUBL: 0.4000 mg | SUBLINGUAL_TABLET | SUBLINGUAL | Status: DC | PRN

## 2016-12-11 MED ORDER — MIRABEGRON ER 50 MG PO TB24
50.0000 mg | ORAL_TABLET | Freq: Every day | ORAL | Status: DC
  Administered 2016-12-12: 50 mg via ORAL
  Filled 2016-12-11: qty 1

## 2016-12-11 MED ORDER — SODIUM CHLORIDE 0.9 % IV BOLUS (SEPSIS)
1000.0000 mL | Freq: Once | INTRAVENOUS | Status: AC
  Administered 2016-12-11: 1000 mL via INTRAVENOUS

## 2016-12-11 MED ORDER — LEVOFLOXACIN 750 MG PO TABS
750.0000 mg | ORAL_TABLET | Freq: Once | ORAL | Status: AC
  Administered 2016-12-11: 750 mg via ORAL
  Filled 2016-12-11: qty 1

## 2016-12-11 MED ORDER — MORPHINE SULFATE (PF) 4 MG/ML IV SOLN: 4.0000 mg | INTRAVENOUS | Status: DC | PRN

## 2016-12-11 MED ORDER — DIPHENHYDRAMINE HCL 25 MG PO CAPS: 50.0000 mg | ORAL_CAPSULE | Freq: Every evening | ORAL | Status: DC | PRN

## 2016-12-11 MED ORDER — PRAVASTATIN SODIUM 20 MG PO TABS
20.0000 mg | ORAL_TABLET | Freq: Every day | ORAL | Status: DC
  Administered 2016-12-11: 20 mg via ORAL
  Filled 2016-12-11: qty 1
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
## 2016-12-11 NOTE — ED Notes (Signed)
Attempted to call report again.

## 2016-12-11 NOTE — ED Triage Notes (Signed)
Pt arrives in no distress ekg complete

## 2016-12-11 NOTE — ED Notes (Signed)
Attempted to call report

## 2016-12-11 NOTE — ED Notes (Signed)
Adjusted pt's nasal canula and increased O2 to 3L

## 2016-12-11 NOTE — Progress Notes (Signed)
Patient arrived in the unit accompanied by NT via stretcher. Orientation to the unit given. Patient verbalizes understanding. 

## 2016-12-11 NOTE — ED Notes (Signed)
Requested Meal Tray be ordered
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
## 2016-12-11 NOTE — ED Notes (Signed)
Pt coughing often, causes her SOB, O2 increased to 4L, will continue to monitor

## 2016-12-12 DIAGNOSIS — J9601 Acute respiratory failure with hypoxia: Secondary | ICD-10-CM | POA: Diagnosis not present

## 2016-12-12 DIAGNOSIS — G4733 Obstructive sleep apnea (adult) (pediatric): Secondary | ICD-10-CM | POA: Diagnosis not present

## 2016-12-12 DIAGNOSIS — J189 Pneumonia, unspecified organism: Secondary | ICD-10-CM | POA: Diagnosis not present

## 2016-12-12 DIAGNOSIS — I10 Essential (primary) hypertension: Secondary | ICD-10-CM | POA: Diagnosis not present

## 2016-12-12 DIAGNOSIS — J181 Lobar pneumonia, unspecified organism: Secondary | ICD-10-CM | POA: Diagnosis not present

## 2016-12-12 DIAGNOSIS — E119 Type 2 diabetes mellitus without complications: Secondary | ICD-10-CM | POA: Diagnosis not present

## 2016-12-12 LAB — LIPID PANEL
CHOL/HDL RATIO: 3.1 ratio
CHOLESTEROL: 118 mg/dL (ref 0–200)
HDL: 38 mg/dL — ABNORMAL LOW (ref >40)
LDL Cholesterol: 30 mg/dL (ref 0–99)
Triglycerides: 249 mg/dL — ABNORMAL HIGH (ref <150)
VLDL: 50 mg/dL — ABNORMAL HIGH (ref 0–40)

## 2016-12-12 LAB — CBC
HCT: 44.4 % (ref 36.0–46.0)
Hemoglobin: 14.8 g/dL (ref 12.0–15.0)
MCH: 31.2 pg (ref 26.0–34.0)
MCHC: 33.3 g/dL (ref 30.0–36.0)
MCV: 93.7 fL (ref 78.0–100.0)
PLATELETS: 222 10*3/uL (ref 150–400)
RBC: 4.74 MIL/uL (ref 3.87–5.11)
RDW: 13.4 % (ref 11.5–15.5)
WBC: 8 10*3/uL (ref 4.0–10.5)

## 2016-12-12 LAB — GLUCOSE, CAPILLARY
Glucose-Capillary: 177 mg/dL — ABNORMAL HIGH (ref 65–99)
Glucose-Capillary: 175 mg/dL — ABNORMAL HIGH (ref 65–99)

## 2016-12-12 LAB — BASIC METABOLIC PANEL
Anion gap: 10 (ref 5–15)
BUN: 13 mg/dL (ref 6–20)
CHLORIDE: 98 mmol/L — AB (ref 101–111)
CO2: 29 mmol/L (ref 22–32)
CREATININE: 0.83 mg/dL (ref 0.44–1.00)
Calcium: 9.5 mg/dL (ref 8.9–10.3)
GFR calc non Af Amer: >60 mL/min (ref >60)
Glucose, Bld: 252 mg/dL — ABNORMAL HIGH (ref 65–99)
Potassium: 3.6 mmol/L (ref 3.5–5.1)
Sodium: 137 mmol/L (ref 135–145)

## 2016-12-12 LAB — TROPONIN I
Troponin I: <0.03 ng/mL (ref <0.03)
Troponin I: <0.03 ng/mL (ref <0.03)

## 2016-12-12 LAB — HIV ANTIBODY (ROUTINE TESTING W REFLEX): HIV SCREEN 4TH GENERATION: NONREACTIVE

## 2016-12-12 MED ORDER — LEVOFLOXACIN 750 MG PO TABS: 750.0000 mg | ORAL_TABLET | Freq: Every day | ORAL | 0 refills | Status: DC

## 2016-12-12 NOTE — Progress Notes (Signed)
SATURATION QUALIFICATIONS: (This note is used to comply with regulatory documentation for home oxygen)  Patient Saturations on Room Air at Rest = 95%  Patient Saturations on Room Air while Ambulating = 91%  Patient Saturations on 0 Liters of oxygen while Ambulating = 91%   Denise [name], CMS [id number]

## 2016-12-12 NOTE — Care Management Note (Signed)
Case Management Note  Patient Details  Name: [name] MRN: [id number] Date of Birth: 1950/11/08  Subjective/Objective:                 No CM needs identified, patient in obs for PNA, no O2 needs for home. No other CM needs identified at his time.    Action/Plan:  DC to home.  Expected Discharge Date:  12/12/16               Expected Discharge Plan:  Home/Self Care  In-House Referral:     Discharge planning Services  CM Consult  Post Acute Care Choice:    Choice offered to:     DME Arranged:    DME Agency:     HH Arranged:    HH Agency:     Status of Service:  Completed, signed off  If discussed at [employer] of Stay Meetings, dates discussed:    Additional Comments:  Carles Collet, RN 12/12/2016, 3:19 PM

## 2016-12-12 NOTE — Discharge Summary (Signed)
Physician Discharge Summary  [name]:[id number] DOB: 1950-12-19 DOA: 12/11/2016  PCP: Gerrit Heck, MD  Admit date: 12/11/2016 Discharge date: 12/12/2016  Admitted From: Home by way of PCP Disposition: Home   Recommendations for Outpatient Follow-up:  1. Follow up with PCP in the next week. Admitted for pneumonia, discharged on levaquin for CAP without oxygen requirement. Had negative ECG and troponins.  2. Follow up blood and sputum cultures (drawn after initial abx)  Home Health: None Equipment/Devices: None Discharge Condition: Stable CODE STATUS: Full Diet recommendation: Heart healthy, carb modified  Brief/Interim Summary: Denise [name] is a 66 y.o. female with a history of OSA, HTN, obesity, and HLD who presented to her PCP's office today for evaluation of cough where CXR showed infiltrate and ECG changes were concerning for ACS, prompting transfer to the ED.   She reports an abrupt onset of persistent cough becoming productive of yellow sputum over the past 7 days associated with myalgias, daily fevers, congestion, and poor appetite, slightly improved with mucinex and cold-eeze. Despite a poor appetite, CBGs have been higher than normal, up to 300. Over the past few days she's developed pressure in the middle of the chest that is mild-moderate, nonradiating, associated with coughing and sometimes light-headedness, intermittent, without associated with exertion/rest or position. No leg swelling/pain, travel, sick contacts. When she presented to the PCP with these concerns a chest xray was performed and an ECG is said to have ST depressions/TWI's in V5-6.   On arrival to the ED she was hypoxemic to 87% on room air, improved with supplemental oxygen. WBC 10.8, Hgb 16.4. Lactate and troponin not elevated. CXR showed RML infiltrate and levaquin was started. ECG showed diffuse T wave flattening without ST depression/elevation very similar to prior tracing from January  2015. Due to ongoing hypoxemia, hospitalists were asked to admit. The following day her dyspnea had mildly improved and she no longer required oxygen with ambulation. Troponins remained negative. She is discharged with oral levaquin prescription and directions to follow up with PCP within the next week. Return precautions were provided.   Discharge Diagnoses:  Principal Problem:   Community acquired pneumonia Active Problems:   DM (diabetes mellitus) ([place])   OSA (obstructive sleep apnea)   Essential hypertension, benign   Dyslipidemia   Obesity   Acute hypoxemic respiratory failure (HCC)  Community-acquired pneumonia: Initially requiring NRB, though hypoxemia with ambulation has resolved.  - Continue levaquin. Sulfa and keflex allergies noted.  - Mucinex, honey, lozenges prn - Leukocytosis resolved. WBC 10.8 > 8.0 - Blood and sputum culture (drawn after abx)  Chest pain: Atypical, likely pleuritic related to cough, though has RF's including HTN, HLD, morbid obesity, DM, OSA. Troponins remained negative and, ECG had stable T wave flattening not consistent with ACS. Wells score is zero.   OSA: Chronic, stable - CPAP qHS  HTN: Chronic, stable.  - Continue home medications   T2DM: Repeat HbA1c was checked and is pending at discharge. Has had recent hyperglycemia related to acute illness. No acidosis or gap on labs.   Overactive bladder: Chronic, stable. - Continue home meds  Discharge Instructions Discharge Instructions    Discharge instructions    Complete by:  As directed    You were admitted with respiratory failure due to pneumonia. Fortunately with antibiotics, your oxygen requirement has resolved and you are stable for discharge with the following recommendations:  - Continue taking levaquin daily for 6 more days starting today.  - Try honey and cough lozenges  over the counter for cough. These have been shown to be at least as effective as other medications to suppress a  cough.  - Your heart work up was completely negative.  - Please call Dr. Drema Dallas' office for a follow up appointment within the next week. - If your symptoms worsen instead of improving as expected, seek medical attention right away.     Allergies as of 12/12/2016      Reactions   Adhesive [tape] Other (See Comments)   Blisters.  Paper tape okay as long removed as soon as possible.    Sulfa Antibiotics Hives   Dulaglutide Diarrhea, Other (See Comments)   Stomach pain (reaction to Trulicity)   Keflex [cephalexin] Rash   Pioglitazone Other (See Comments)   Dizziness /high anxiety (reaction to Actos)      Medication List    TAKE these medications   acetaminophen 500 MG tablet Commonly known as:  TYLENOL Take 1,000 mg by mouth every 6 (six) hours as needed for headache (pain).   ALEVE PM 220-25 MG Tabs Generic drug:  Naproxen Sod-Diphenhydramine Take 2 tablets by mouth at bedtime.   Biotin 1000 MCG tablet Take 1,000 mcg by mouth 2 (two) times daily.   cholecalciferol 1000 units tablet Commonly known as:  VITAMIN D Take 1,000 Units by mouth daily.   CoQ10 100 MG Caps Take 100 mg by mouth daily.   ibuprofen 200 MG tablet Commonly known as:  ADVIL,MOTRIN Take 400 mg by mouth every 6 (six) hours as needed (pain).   indapamide 1.25 MG tablet Commonly known as:  LOZOL Take 1.25 mg by mouth at bedtime.   JARDIANCE 25 MG Tabs tablet Generic drug:  empagliflozin Take 25 mg by mouth daily.   levofloxacin 750 MG tablet Commonly known as:  LEVAQUIN Take 1 tablet (750 mg total) by mouth daily.   loratadine 10 MG tablet Commonly known as:  CLARITIN Take 10 mg by mouth daily as needed (seasonal allergies).   metFORMIN 500 MG 24 hr tablet Commonly known as:  GLUCOPHAGE-XR Take 1,000 mg by mouth 2 (two) times daily.   multivitamin with minerals Tabs tablet Take 1 tablet by mouth daily.   MYRBETRIQ 50 MG Tb24 tablet Generic drug:  mirabegron ER Take 50 mg by mouth daily.    oxybutynin 5 MG tablet Commonly known as:  DITROPAN Take 1 tablet (5 mg total) by mouth every 8 (eight) hours as needed for bladder spasms.   oxyCODONE-acetaminophen 5-325 MG tablet Commonly known as:  ROXICET Take 1-2 tablets by mouth every 6 (six) hours as needed for severe pain.   pravastatin 20 MG tablet Commonly known as:  PRAVACHOL Take 20 mg by mouth at bedtime.   PRESCRIPTION MEDICATION Inhale into the lungs at bedtime. CPAP   VITAMIN E PO Take 1 capsule by mouth daily.      Follow-up Information    Gerrit Heck, MD. Schedule an appointment as soon as possible for a visit in 1 week(s).   Specialty:  Family Medicine Contact information: [place] [address] [phone]          Allergies  Allergen Reactions  . Adhesive [Tape] Other (See Comments)    Blisters.  Paper tape okay as long removed as soon as possible.   . Sulfa Antibiotics Hives  . Dulaglutide Diarrhea and Other (See Comments)    Stomach pain (reaction to Trulicity)  . Keflex [Cephalexin] Rash  . Pioglitazone Other (See Comments)    Dizziness /high  anxiety (reaction to Actos)   Consultations:  None  Procedures/Studies: Dg Chest 2 View  Result Date: 12/11/2016 CLINICAL DATA:  Productive cough, shortness of breath, hypoxia x1 week EXAM: CHEST  2 VIEW COMPARISON:  [facility] chest radiographs dated 12/11/2016 FINDINGS: Mild right lower lobe opacity, suspicious for pneumonia. Left basilar opacity, possibly atelectasis. No pleural effusion or pneumothorax. The heart is normal in size. Mild degenerative changes of the visualized thoracolumbar spine. Cervical spine fixation hardware. IMPRESSION: Mild right lower lobe opacity, suspicious for pneumonia. Electronically Signed   By: Julian Hy M.D.   On: 12/11/2016 17:10   Subjective: Patient reports improving dyspnea. Walked this morning without oxygen with SpO2 nadir of 91%. No chest pain,  palpitations, leg swelling, orthopnea.   Discharge Exam: Vitals:   12/12/16 0555 12/12/16 1300  BP: 118/66 127/77  Pulse:  (!) 103  Resp: 20 20  Temp: 98.3 F (36.8 C)    General: Pt is alert, awake, not in acute distress Cardiovascular: RRR, no murmur. No JVD or LE edema Respiratory: Nonlabored on room air, expiratory rhonchi and decreased breath sounds on the right mid and lower lung zones  Labs: Basic Metabolic Panel:  Recent Labs Lab 12/11/16 2127 12/12/16 0957  NA  --  137  K  --  3.6  CL  --  98*  CO2  --  29  GLUCOSE  --  252*  BUN  --  13  CREATININE 0.82 0.83  CALCIUM  --  9.5   CBC:  Recent Labs Lab 12/11/16 1649 12/12/16 0957  WBC 10.8* 8.0  NEUTROABS 7.5  --   HGB 16.4* 14.8  HCT 48.3* 44.4  MCV 93.4 93.7  PLT 243 222   Cardiac Enzymes:  Recent Labs Lab 12/11/16 2127 12/12/16 0219 12/12/16 0957  TROPONINI <0.03 <0.03 <0.03   CBG:  Recent Labs Lab 12/11/16 2010 12/11/16 2209 12/12/16 0758 12/12/16 1139  GLUCAP 248* 189* 177* 175*   Lipid Profile  Recent Labs  12/12/16 0219  CHOL 118  HDL 38*  LDLCALC 30  TRIG 249*  CHOLHDL 3.1   Thyroid function studies  Recent Labs  12/11/16 2127  TSH 2.021   Urinalysis    Component Value Date/Time   COLORURINE AMBER (A) 03/19/2014 1202   APPEARANCEUR CLOUDY (A) 03/19/2014 1202   LABSPEC 1.006 03/19/2014 1202   PHURINE 7.0 03/19/2014 1202   GLUCOSEU NEGATIVE 03/19/2014 1202   HGBUR LARGE (A) 03/19/2014 1202   BILIRUBINUR NEGATIVE 03/19/2014 1202   KETONESUR NEGATIVE 03/19/2014 1202   PROTEINUR 100 (A) 03/19/2014 1202   UROBILINOGEN 1.0 03/19/2014 1202   NITRITE NEGATIVE 03/19/2014 1202   LEUKOCYTESUR MODERATE (A) 03/19/2014 1202    Microbiology Recent Results (from the past 240 hour(s))  Culture, blood (routine x 2)     Status: None (Preliminary result)   Collection Time: 12/11/16  9:20 PM  Result Value Ref Range Status   Specimen Description BLOOD RIGHT ANTECUBITAL   Final   Special Requests BOTTLES DRAWN AEROBIC AND ANAEROBIC 5CC EACH  Final   Culture PENDING  Incomplete   Report Status PENDING  Incomplete    Time coordinating discharge: Approximately 40 minutes  Vance Gather, MD  [facility] 12/12/2016, 2:28 PM Pager [phone]

## 2016-12-12 NOTE — Progress Notes (Signed)
12/12/2016- Respiratory care note- Pt wore home CPAP last pm.

## 2016-12-12 NOTE — Progress Notes (Signed)
Patient had an episode of choking on her lunch.  Patient was able to call for help.  Staff found patient coughing uncontrollably.  Patient had stridor on inspiration.  Patient able to talk.  Stated she needed time to recover.  O2 on at 2L.  Patient sats 100%.  Patient recovered and stated "I took a bite with my mouth so dry".  Patient also stated " My whole chest seized, I have never felt like that." Will monitor patient. Marcille Blanco, RN

## 2016-12-13 LAB — HEMOGLOBIN A1C
HEMOGLOBIN A1C: 7.4 % — AB (ref 4.8–5.6)
MEAN PLASMA GLUCOSE: 166 mg/dL

## 2016-12-16 LAB — CULTURE, BLOOD (ROUTINE X 2)
CULTURE: NO GROWTH
Culture: NO GROWTH

## 2016-12-19 DIAGNOSIS — J189 Pneumonia, unspecified organism: Secondary | ICD-10-CM | POA: Diagnosis not present

## 2017-01-18 DIAGNOSIS — E782 Mixed hyperlipidemia: Secondary | ICD-10-CM | POA: Diagnosis not present

## 2017-01-18 DIAGNOSIS — E1165 Type 2 diabetes mellitus with hyperglycemia: Secondary | ICD-10-CM | POA: Diagnosis not present

## 2017-01-18 DIAGNOSIS — I10 Essential (primary) hypertension: Secondary | ICD-10-CM | POA: Diagnosis not present

## 2017-01-18 DIAGNOSIS — E559 Vitamin D deficiency, unspecified: Secondary | ICD-10-CM | POA: Diagnosis not present

## 2017-01-18 DIAGNOSIS — G4733 Obstructive sleep apnea (adult) (pediatric): Secondary | ICD-10-CM | POA: Diagnosis not present

## 2017-03-23 DIAGNOSIS — M5442 Lumbago with sciatica, left side: Secondary | ICD-10-CM | POA: Diagnosis not present

## 2017-03-23 DIAGNOSIS — M4686 Other specified inflammatory spondylopathies, lumbar region: Secondary | ICD-10-CM | POA: Diagnosis not present

## 2017-03-23 DIAGNOSIS — M5136 Other intervertebral disc degeneration, lumbar region: Secondary | ICD-10-CM | POA: Diagnosis not present

## 2017-03-23 DIAGNOSIS — G8929 Other chronic pain: Secondary | ICD-10-CM | POA: Diagnosis not present

## 2017-04-05 DIAGNOSIS — M5136 Other intervertebral disc degeneration, lumbar region: Secondary | ICD-10-CM | POA: Diagnosis not present

## 2017-04-17 DIAGNOSIS — M5136 Other intervertebral disc degeneration, lumbar region: Secondary | ICD-10-CM | POA: Diagnosis not present

## 2017-04-17 DIAGNOSIS — M4686 Other specified inflammatory spondylopathies, lumbar region: Secondary | ICD-10-CM | POA: Diagnosis not present

## 2017-04-17 DIAGNOSIS — M5442 Lumbago with sciatica, left side: Secondary | ICD-10-CM | POA: Diagnosis not present

## 2017-04-17 DIAGNOSIS — G8929 Other chronic pain: Secondary | ICD-10-CM | POA: Diagnosis not present

## 2017-04-24 DIAGNOSIS — N3946 Mixed incontinence: Secondary | ICD-10-CM | POA: Diagnosis not present

## 2017-04-24 DIAGNOSIS — N3020 Other chronic cystitis without hematuria: Secondary | ICD-10-CM | POA: Diagnosis not present

## 2017-04-24 DIAGNOSIS — N200 Calculus of kidney: Secondary | ICD-10-CM | POA: Diagnosis not present

## 2017-04-26 ENCOUNTER — Other Ambulatory Visit: Payer: Self-pay | Admitting: Orthopedic Surgery

## 2017-04-26 DIAGNOSIS — I10 Essential (primary) hypertension: Secondary | ICD-10-CM | POA: Diagnosis not present

## 2017-04-26 DIAGNOSIS — G4733 Obstructive sleep apnea (adult) (pediatric): Secondary | ICD-10-CM | POA: Diagnosis not present

## 2017-04-26 DIAGNOSIS — E559 Vitamin D deficiency, unspecified: Secondary | ICD-10-CM | POA: Diagnosis not present

## 2017-04-26 DIAGNOSIS — M5136 Other intervertebral disc degeneration, lumbar region: Secondary | ICD-10-CM

## 2017-04-26 DIAGNOSIS — E782 Mixed hyperlipidemia: Secondary | ICD-10-CM | POA: Diagnosis not present

## 2017-04-26 DIAGNOSIS — E1165 Type 2 diabetes mellitus with hyperglycemia: Secondary | ICD-10-CM | POA: Diagnosis not present

## 2017-04-27 DIAGNOSIS — Z6841 Body Mass Index (BMI) 40.0 and over, adult: Secondary | ICD-10-CM | POA: Diagnosis not present

## 2017-04-27 DIAGNOSIS — Z01419 Encounter for gynecological examination (general) (routine) without abnormal findings: Secondary | ICD-10-CM | POA: Diagnosis not present

## 2017-04-27 DIAGNOSIS — Z1231 Encounter for screening mammogram for malignant neoplasm of breast: Secondary | ICD-10-CM | POA: Diagnosis not present

## 2017-05-04 ENCOUNTER — Ambulatory Visit
Admission: RE | Admit: 2017-05-04 | Discharge: 2017-05-04 | Disposition: A | Discharge status: Home / Self Care | Payer: BC Managed Care – PPO | Source: Ambulatory Visit | Attending: Orthopedic Surgery | Admitting: Orthopedic Surgery

## 2017-05-04 DIAGNOSIS — M5136 Other intervertebral disc degeneration, lumbar region: Secondary | ICD-10-CM

## 2017-05-04 DIAGNOSIS — M461 Sacroiliitis, not elsewhere classified: Secondary | ICD-10-CM | POA: Diagnosis not present

## 2017-05-04 MED ORDER — METHYLPREDNISOLONE ACETATE 40 MG/ML INJ SUSP (RADIOLOG: 120.0000 mg | Freq: Once | INTRAMUSCULAR | Status: DC

## 2017-05-17 DIAGNOSIS — N3020 Other chronic cystitis without hematuria: Secondary | ICD-10-CM | POA: Diagnosis not present

## 2017-05-17 DIAGNOSIS — N3946 Mixed incontinence: Secondary | ICD-10-CM | POA: Diagnosis not present

## 2017-05-18 DIAGNOSIS — M2550 Pain in unspecified joint: Secondary | ICD-10-CM | POA: Diagnosis not present

## 2017-05-18 DIAGNOSIS — M533 Sacrococcygeal disorders, not elsewhere classified: Secondary | ICD-10-CM | POA: Diagnosis not present

## 2017-05-18 DIAGNOSIS — M1990 Unspecified osteoarthritis, unspecified site: Secondary | ICD-10-CM | POA: Diagnosis not present

## 2017-05-18 DIAGNOSIS — M25512 Pain in left shoulder: Secondary | ICD-10-CM | POA: Diagnosis not present

## 2017-05-18 DIAGNOSIS — M79643 Pain in unspecified hand: Secondary | ICD-10-CM | POA: Diagnosis not present

## 2017-05-18 DIAGNOSIS — M797 Fibromyalgia: Secondary | ICD-10-CM | POA: Diagnosis not present

## 2017-05-18 DIAGNOSIS — L659 Nonscarring hair loss, unspecified: Secondary | ICD-10-CM | POA: Diagnosis not present

## 2017-05-18 DIAGNOSIS — M549 Dorsalgia, unspecified: Secondary | ICD-10-CM | POA: Diagnosis not present

## 2017-06-21 DIAGNOSIS — R350 Frequency of micturition: Secondary | ICD-10-CM | POA: Diagnosis not present

## 2017-06-21 DIAGNOSIS — N3946 Mixed incontinence: Secondary | ICD-10-CM | POA: Diagnosis not present

## 2017-06-29 DIAGNOSIS — M5136 Other intervertebral disc degeneration, lumbar region: Secondary | ICD-10-CM | POA: Diagnosis not present

## 2017-06-29 DIAGNOSIS — M47816 Spondylosis without myelopathy or radiculopathy, lumbar region: Secondary | ICD-10-CM | POA: Diagnosis not present

## 2017-07-05 DIAGNOSIS — M797 Fibromyalgia: Secondary | ICD-10-CM | POA: Diagnosis not present

## 2017-07-05 DIAGNOSIS — M5432 Sciatica, left side: Secondary | ICD-10-CM | POA: Diagnosis not present

## 2017-07-05 DIAGNOSIS — Z23 Encounter for immunization: Secondary | ICD-10-CM | POA: Diagnosis not present

## 2017-07-05 DIAGNOSIS — G8929 Other chronic pain: Secondary | ICD-10-CM | POA: Diagnosis not present

## 2017-07-23 DIAGNOSIS — E782 Mixed hyperlipidemia: Secondary | ICD-10-CM | POA: Diagnosis not present

## 2017-07-23 DIAGNOSIS — E1165 Type 2 diabetes mellitus with hyperglycemia: Secondary | ICD-10-CM | POA: Diagnosis not present

## 2017-07-26 DIAGNOSIS — G4733 Obstructive sleep apnea (adult) (pediatric): Secondary | ICD-10-CM | POA: Diagnosis not present

## 2017-07-26 DIAGNOSIS — Z79899 Other long term (current) drug therapy: Secondary | ICD-10-CM | POA: Diagnosis not present

## 2017-07-26 DIAGNOSIS — Z6841 Body Mass Index (BMI) 40.0 and over, adult: Secondary | ICD-10-CM | POA: Diagnosis not present

## 2017-07-26 DIAGNOSIS — Z9989 Dependence on other enabling machines and devices: Secondary | ICD-10-CM | POA: Diagnosis not present

## 2017-07-26 DIAGNOSIS — E669 Obesity, unspecified: Secondary | ICD-10-CM | POA: Diagnosis not present

## 2017-07-26 DIAGNOSIS — E559 Vitamin D deficiency, unspecified: Secondary | ICD-10-CM | POA: Diagnosis not present

## 2017-07-26 DIAGNOSIS — I10 Essential (primary) hypertension: Secondary | ICD-10-CM | POA: Diagnosis not present

## 2017-07-26 DIAGNOSIS — E1165 Type 2 diabetes mellitus with hyperglycemia: Secondary | ICD-10-CM | POA: Diagnosis not present

## 2017-07-26 DIAGNOSIS — E782 Mixed hyperlipidemia: Secondary | ICD-10-CM | POA: Diagnosis not present

## 2017-08-16 ENCOUNTER — Encounter: Payer: Self-pay | Admitting: Physical Medicine & Rehabilitation

## 2017-08-16 ENCOUNTER — Ambulatory Visit (HOSPITAL_BASED_OUTPATIENT_CLINIC_OR_DEPARTMENT_OTHER): Payer: BC Managed Care – PPO | Admitting: Physical Medicine & Rehabilitation

## 2017-08-16 ENCOUNTER — Encounter: Payer: BC Managed Care – PPO | Attending: Physical Medicine & Rehabilitation

## 2017-08-16 ENCOUNTER — Other Ambulatory Visit: Payer: Self-pay

## 2017-08-16 VITALS — BP 127/84 | HR 91 | Resp 14

## 2017-08-16 DIAGNOSIS — M797 Fibromyalgia: Secondary | ICD-10-CM

## 2017-08-16 DIAGNOSIS — M545 Low back pain: Secondary | ICD-10-CM | POA: Diagnosis not present

## 2017-08-16 DIAGNOSIS — G8929 Other chronic pain: Secondary | ICD-10-CM | POA: Diagnosis not present

## 2017-08-16 DIAGNOSIS — M5432 Sciatica, left side: Secondary | ICD-10-CM | POA: Diagnosis not present

## 2017-08-16 MED ORDER — PREGABALIN 25 MG PO CAPS: 25.0000 mg | ORAL_CAPSULE | Freq: Two times a day (BID) | ORAL | 0 refills | Status: DC
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
## 2017-08-16 NOTE — Patient Instructions (Signed)
Call [employer] for an aquatics either fibromyalgia or low impact aerobic  Lyrica low dose

## 2017-09-14 ENCOUNTER — Other Ambulatory Visit: Payer: Self-pay | Admitting: Physical Medicine & Rehabilitation

## 2017-09-27 ENCOUNTER — Encounter: Payer: Self-pay | Admitting: Physical Medicine & Rehabilitation

## 2017-09-27 ENCOUNTER — Encounter: Payer: BC Managed Care – PPO | Attending: Physical Medicine & Rehabilitation

## 2017-09-27 ENCOUNTER — Ambulatory Visit (HOSPITAL_BASED_OUTPATIENT_CLINIC_OR_DEPARTMENT_OTHER): Payer: BC Managed Care – PPO | Admitting: Physical Medicine & Rehabilitation

## 2017-09-27 ENCOUNTER — Other Ambulatory Visit: Payer: Self-pay

## 2017-09-27 VITALS — BP 134/76 | HR 94

## 2017-09-27 DIAGNOSIS — M25552 Pain in left hip: Secondary | ICD-10-CM | POA: Diagnosis not present

## 2017-09-27 DIAGNOSIS — M797 Fibromyalgia: Secondary | ICD-10-CM | POA: Insufficient documentation

## 2017-09-27 DIAGNOSIS — M545 Low back pain: Secondary | ICD-10-CM | POA: Diagnosis not present

## 2017-09-27 DIAGNOSIS — G8929 Other chronic pain: Secondary | ICD-10-CM | POA: Diagnosis not present

## 2017-09-27 MED ORDER — PREGABALIN 50 MG PO CAPS: 50.0000 mg | ORAL_CAPSULE | Freq: Two times a day (BID) | ORAL | 1 refills | Status: DC

## 2017-09-27 NOTE — Patient Instructions (Signed)
Schedule for Left hip xray guided injection  May need to do EMG/NCV of both lower limbs
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
## 2017-10-22 DIAGNOSIS — Z79899 Other long term (current) drug therapy: Secondary | ICD-10-CM | POA: Diagnosis not present

## 2017-10-22 DIAGNOSIS — E782 Mixed hyperlipidemia: Secondary | ICD-10-CM | POA: Diagnosis not present

## 2017-10-22 DIAGNOSIS — E1165 Type 2 diabetes mellitus with hyperglycemia: Secondary | ICD-10-CM | POA: Diagnosis not present

## 2017-10-23 ENCOUNTER — Ambulatory Visit (HOSPITAL_BASED_OUTPATIENT_CLINIC_OR_DEPARTMENT_OTHER): Payer: BC Managed Care – PPO | Admitting: Physical Medicine & Rehabilitation

## 2017-10-23 ENCOUNTER — Encounter: Payer: Self-pay | Admitting: Physical Medicine & Rehabilitation

## 2017-10-23 ENCOUNTER — Encounter: Payer: BC Managed Care – PPO | Attending: Physical Medicine & Rehabilitation

## 2017-10-23 VITALS — BP 148/82 | HR 88 | Resp 14

## 2017-10-23 DIAGNOSIS — G8929 Other chronic pain: Secondary | ICD-10-CM | POA: Diagnosis not present

## 2017-10-23 DIAGNOSIS — M1612 Unilateral primary osteoarthritis, left hip: Secondary | ICD-10-CM

## 2017-10-23 DIAGNOSIS — M25552 Pain in left hip: Secondary | ICD-10-CM

## 2017-10-23 DIAGNOSIS — M797 Fibromyalgia: Secondary | ICD-10-CM | POA: Diagnosis not present

## 2017-10-23 DIAGNOSIS — M545 Low back pain: Secondary | ICD-10-CM | POA: Insufficient documentation

## 2017-10-23 MED ORDER — PREGABALIN 50 MG PO CAPS: 50.0000 mg | ORAL_CAPSULE | Freq: Two times a day (BID) | ORAL | 1 refills | Status: DC

## 2017-10-23 NOTE — Patient Instructions (Addendum)
Left hip joint injection today  Left sacroiliac nerve blocks next visit  Lyrica prescribed for fibromyalgia

## 2017-10-23 NOTE — Progress Notes (Signed)
hip intra-articular injection under fluoro guidance  Indication osteoarthritis unresponsive to conservative care including exercise and oral medications  Informed consent was obtained after describing risks and benefits of the procedure, this includes bleeding bruising and infection. The patient elected to proceed and has given written consent Patient placed supine on fluoro table.  Femur was identified distally and followed proximally to the femoral neck. Area was marked and prepped with Betadine. 25-gauge 1.5 inch needle was utilized to anesthetize the skin and subcutaneous tissue with 3 cc of 1% lidocaine. Then a 22-gauge ankle block 80 mm needle was inserted under Fluoro, targeting the junction of the femoral head and femoral neck. Bone contact was made. Then a solution containing 1.5 mL of 6 mg per mL/tone and 3.5 ML of 1% lidocaine were injected after negative drawback for blood. Patient tolerated procedure well. Post procedure instructions given.

## 2017-10-23 NOTE — Progress Notes (Signed)
  PROCEDURE RECORD [facility]   Name: Denise [name] DOB:March 14, 1951 MRN: [id number]  Date:10/23/2017  Physician: Alysia Penna, MD    Nurse/CMA: Lanna Labella, CMA  Allergies:  Allergies  Allergen Reactions  . Adhesive [Tape] Other (See Comments)    Blisters.  Paper tape okay as long removed as soon as possible.   . Sulfa Antibiotics Hives  . Dulaglutide Diarrhea and Other (See Comments)    Stomach pain (reaction to Trulicity)  . Keflex [Cephalexin] Rash  . Pioglitazone Other (See Comments)    Dizziness /high anxiety (reaction to Actos)    Consent Signed: Yes.    Is patient diabetic? Yes.    CBG today? 143  Pregnant: No. LMP: No LMP recorded. Patient is postmenopausal. (age 32-55)  Anticoagulants: no Anti-inflammatory: yes (advil 7:30am) Antibiotics: no  Procedure: left intra-articular hip injection  Position: Prone Start Time: 10:37am  End Time: 10:43am  Fluoro Time: 39s  RN/CMA Hue Steveson, CMA Camie Hauss, CMA    Time 10:20am 10:49am    BP 148/82 146/91    Pulse 88 92    Respirations 14 14    O2 Sat 93 93    S/S 6 6    Pain Level 7/10 3/10     D/C home with Self (no driver required), patient A & O X 3, D/C instructions reviewed, and sits independently.

## 2017-11-19 ENCOUNTER — Encounter: Payer: BC Managed Care – PPO | Attending: Physical Medicine & Rehabilitation

## 2017-11-19 ENCOUNTER — Encounter: Payer: Self-pay | Admitting: Physical Medicine & Rehabilitation

## 2017-11-19 ENCOUNTER — Ambulatory Visit (HOSPITAL_BASED_OUTPATIENT_CLINIC_OR_DEPARTMENT_OTHER): Payer: BC Managed Care – PPO | Admitting: Physical Medicine & Rehabilitation

## 2017-11-19 VITALS — BP 128/76 | HR 100 | Resp 14

## 2017-11-19 DIAGNOSIS — M797 Fibromyalgia: Secondary | ICD-10-CM | POA: Insufficient documentation

## 2017-11-19 DIAGNOSIS — M545 Low back pain: Secondary | ICD-10-CM | POA: Diagnosis not present

## 2017-11-19 DIAGNOSIS — G8929 Other chronic pain: Secondary | ICD-10-CM | POA: Insufficient documentation

## 2017-11-19 DIAGNOSIS — M533 Sacrococcygeal disorders, not elsewhere classified: Secondary | ICD-10-CM | POA: Diagnosis not present

## 2017-11-19 NOTE — Progress Notes (Signed)
L5 dorsal ramus S1-S2-S3 lateral branch blocks under fluoroscopic guidance Left side  Informed consent was obtained after describing risks and benefits of the procedure with patient these include bleeding bruising and infection.  He elects to proceed and has given written consent.  Patient placed prone on fluoroscopy table Betadine prep sterile drape a 25-gauge 1.5 inch needle was used to anesthetize skin and subcu tissue with 1% lidocaine 1 cc into each of 4 sites.  Then a 22-gauge 3.5" needle was inserted under fluoroscopic guidance for starting the S1 SAP sacral ala junction.  Bone contact made.  Isovue 200 x 0.5 mL demonstrated no intravascular uptake then 0.5 mL of 2% lidocaine was injected.  Then the lateral aspect of the S1, S2, S3 foramen was targeted.  Bone contact made out, Isovue-200 times 0.5 mL demonstrated no nerve root or intravascular uptake within 0.5 mL of 2% lidocaine solution was injected after negative drawback for blood.  Patient tolerated procedure well.  Postinjection instructions given. 

## 2017-11-19 NOTE — Progress Notes (Signed)
  PROCEDURE RECORD [facility]   Name: EMMER LILLIBRIDGE DOB:1950-11-01 MRN: [id number]  Date:11/19/2017  Physician: Alysia Penna, MD    Nurse/CMA: Trew Sunde, CMA  Allergies:  Allergies  Allergen Reactions  . Adhesive [Tape] Other (See Comments)    Blisters.  Paper tape okay as long removed as soon as possible.   . Sulfa Antibiotics Hives  . Dulaglutide Diarrhea and Other (See Comments)    Stomach pain (reaction to Trulicity)  . Keflex [Cephalexin] Rash  . Pioglitazone Other (See Comments)    Dizziness /high anxiety (reaction to Actos)    Consent Signed: Yes.    Is patient diabetic? Yes.    CBG today? 150  Pregnant: No. LMP: No LMP recorded. Patient is postmenopausal. (age 30-55)  Anticoagulants: no Anti-inflammatory: no Antibiotics: no  Procedure: left L5, S1, S2, S3 lateral branch block  Position: Prone Start Time: 4:25p  End Time: 4:37pm  Fluoro Time:   RN/CMA Dayan Kreis, CMA Yug Loria, CMA    Time 3:40pm 4:40pm    BP 128/76 150/82    Pulse 100 92    Respirations 14 14    O2 Sat 99 95    S/S 6 6    Pain Level 6/10 0/10    0/1 D/C home with husband Clair Gulling), patient A & O X 3, D/C instructions reviewed, and sits independently.

## 2017-11-19 NOTE — Patient Instructions (Signed)
Nerve blocks to sacroiliac, will repeat if short term relief

## 2017-11-20 ENCOUNTER — Ambulatory Visit: Payer: BC Managed Care – PPO | Admitting: Physical Medicine & Rehabilitation

## 2017-11-20 DIAGNOSIS — E669 Obesity, unspecified: Secondary | ICD-10-CM | POA: Diagnosis not present

## 2017-11-20 DIAGNOSIS — I10 Essential (primary) hypertension: Secondary | ICD-10-CM | POA: Diagnosis not present

## 2017-11-20 DIAGNOSIS — Z7984 Long term (current) use of oral hypoglycemic drugs: Secondary | ICD-10-CM | POA: Diagnosis not present

## 2017-11-20 DIAGNOSIS — Z6841 Body Mass Index (BMI) 40.0 and over, adult: Secondary | ICD-10-CM | POA: Diagnosis not present

## 2017-11-20 DIAGNOSIS — Z79899 Other long term (current) drug therapy: Secondary | ICD-10-CM | POA: Diagnosis not present

## 2017-11-20 DIAGNOSIS — E1165 Type 2 diabetes mellitus with hyperglycemia: Secondary | ICD-10-CM | POA: Diagnosis not present

## 2017-11-20 DIAGNOSIS — E782 Mixed hyperlipidemia: Secondary | ICD-10-CM | POA: Diagnosis not present

## 2017-11-20 DIAGNOSIS — E559 Vitamin D deficiency, unspecified: Secondary | ICD-10-CM | POA: Diagnosis not present

## 2017-11-20 DIAGNOSIS — G4733 Obstructive sleep apnea (adult) (pediatric): Secondary | ICD-10-CM | POA: Diagnosis not present

## 2017-11-20 DIAGNOSIS — H0019 Chalazion unspecified eye, unspecified eyelid: Secondary | ICD-10-CM | POA: Diagnosis not present

## 2017-11-26 DIAGNOSIS — H0012 Chalazion right lower eyelid: Secondary | ICD-10-CM | POA: Diagnosis not present

## 2017-11-26 DIAGNOSIS — H16223 Keratoconjunctivitis sicca, not specified as Sjogren's, bilateral: Secondary | ICD-10-CM | POA: Diagnosis not present

## 2017-12-17 ENCOUNTER — Ambulatory Visit (HOSPITAL_BASED_OUTPATIENT_CLINIC_OR_DEPARTMENT_OTHER): Payer: Medicare Other | Admitting: Physical Medicine & Rehabilitation

## 2017-12-17 ENCOUNTER — Encounter: Payer: Medicare Other | Attending: Physical Medicine & Rehabilitation

## 2017-12-17 ENCOUNTER — Encounter: Payer: Self-pay | Admitting: Physical Medicine & Rehabilitation

## 2017-12-17 VITALS — BP 124/80 | HR 83 | Ht 66.5 in | Wt 250.0 lb

## 2017-12-17 DIAGNOSIS — M797 Fibromyalgia: Secondary | ICD-10-CM | POA: Diagnosis not present

## 2017-12-17 DIAGNOSIS — M533 Sacrococcygeal disorders, not elsewhere classified: Secondary | ICD-10-CM | POA: Insufficient documentation

## 2017-12-17 DIAGNOSIS — G8929 Other chronic pain: Secondary | ICD-10-CM | POA: Diagnosis not present

## 2017-12-17 DIAGNOSIS — M545 Low back pain: Secondary | ICD-10-CM | POA: Insufficient documentation

## 2017-12-17 MED ORDER — PREGABALIN 75 MG PO CAPS: 75.0000 mg | ORAL_CAPSULE | Freq: Two times a day (BID) | ORAL | 1 refills | Status: DC

## 2017-12-17 MED ORDER — DIAZEPAM 10 MG PO TABS: 10.0000 mg | ORAL_TABLET | Freq: Once | ORAL | 0 refills | Status: AC

## 2017-12-17 NOTE — Progress Notes (Signed)
  PROCEDURE RECORD [facility]   Name: Denise [name] DOB:21-Dec-1950 MRN: [id number]  Date:12/17/2017  Physician: Alysia Penna, MD    Nurse/CMA: Bright CMA / Wessling CMA  Allergies:  Allergies  Allergen Reactions  . Adhesive [Tape] Other (See Comments)    Blisters.  Paper tape okay as long removed as soon as possible.   . Sulfa Antibiotics Hives  . Dulaglutide Diarrhea and Other (See Comments)    Stomach pain (reaction to Trulicity)  . Keflex [Cephalexin] Rash  . Pioglitazone Other (See Comments)    Dizziness /high anxiety (reaction to Actos)    Consent Signed: Yes.    Is patient diabetic? Yes.    CBG today? 143   Pregnant: No. LMP: No LMP recorded. Patient is postmenopausal. (age 62-55)  Anticoagulants: no Anti-inflammatory: yes (alieve this AM) Antibiotics: no  Procedure: Left L5 Dorsal Ramus and S1-3 Lateral Branch Blocks Position: Prone   Start Time: 4:17pm  End Time: 4:25p   Fluoro Time: 34s  RN/CMA Bright CMA Wessling, CMA    Time 3:42pm 4:30 pm    BP 124/80 130/83    Pulse 83 89    Respirations 16 16    O2 Sat 96 94    S/S 6 6    Pain Level 6/10 0/10     D/C home with Husband, patient A & O X 3, D/C instructions reviewed, and sits independently.

## 2017-12-17 NOTE — Patient Instructions (Addendum)
Schedule for radiofreq if this block produced a sort term relief >50%

## 2017-12-17 NOTE — Progress Notes (Signed)
L5 dorsal ramus S1-S2-S3 lateral branch blocks under fluoroscopic guidance Left side  Informed consent was obtained after describing risks and benefits of the procedure with patient these include bleeding bruising and infection.  He elects to proceed and has given written consent.  Patient placed prone on fluoroscopy table Betadine prep sterile drape a 25-gauge 1.5 inch needle was used to anesthetize skin and subcu tissue with 1% lidocaine 1 cc into each of 4 sites.  Then a 22-gauge 3.5" needle was inserted under fluoroscopic guidance for starting the S1 SAP sacral ala junction.  Bone contact made.  Isovue 200 x 0.5 mL demonstrated no intravascular uptake then 0.5 mL of 2% lidocaine was injected.  Then the lateral aspect of the S1, S2, S3 foramen was targeted.  Bone contact made out, Isovue-200 times 0.5 mL demonstrated no nerve root or intravascular uptake within 0.5 mL of 2% lidocaine solution was injected after negative drawback for blood.  Patient tolerated procedure well.  Postinjection instructions given. Preinjection pain 6/10 Post injection pain 0/10

## 2017-12-27 ENCOUNTER — Other Ambulatory Visit: Payer: Self-pay | Admitting: Physical Medicine & Rehabilitation

## 2018-01-21 ENCOUNTER — Ambulatory Visit (HOSPITAL_BASED_OUTPATIENT_CLINIC_OR_DEPARTMENT_OTHER): Payer: BC Managed Care – PPO | Admitting: Physical Medicine & Rehabilitation

## 2018-01-21 ENCOUNTER — Encounter: Payer: Self-pay | Admitting: Physical Medicine & Rehabilitation

## 2018-01-21 ENCOUNTER — Encounter: Payer: Medicare Other | Attending: Physical Medicine & Rehabilitation

## 2018-01-21 VITALS — BP 117/73 | HR 95 | Ht 66.5 in | Wt 250.0 lb

## 2018-01-21 DIAGNOSIS — G8929 Other chronic pain: Secondary | ICD-10-CM | POA: Diagnosis not present

## 2018-01-21 DIAGNOSIS — M533 Sacrococcygeal disorders, not elsewhere classified: Secondary | ICD-10-CM

## 2018-01-21 DIAGNOSIS — M545 Low back pain: Secondary | ICD-10-CM | POA: Diagnosis not present

## 2018-01-21 DIAGNOSIS — M797 Fibromyalgia: Secondary | ICD-10-CM | POA: Diagnosis not present

## 2018-01-21 NOTE — Patient Instructions (Addendum)
You had a radio frequency procedure today This was done to alleviate joint pain in your lumbar area We injected lidocaine which is a local anesthetic.  You may experience soreness at the injection sites. You may also experienced some irritation of the nerves that were heated I'm recommending ice for 30 minutes every 2 hours as needed for the next 24-48 hours In addition please take an extra Lyrica today

## 2018-01-21 NOTE — Progress Notes (Signed)
LEFT Sacroiliac radio frequency ablation under fluoroscopic guidance This consists of L4 medial branch, L5 dorsal ramus radio frequency ablation plus neuro lysis of S1-S2 -S3 dorsal rami  Indication is sacroiliac pain which has improved temporarily on 2 or more occasions by at least 50% following sacroiliac intra-articular injection under fluoroscopic guidance. Pain interferes with self-care and mobility and has failed to respond to conservative measures.  Informed consent was obtained after discussing risks and benefits of the procedure with the patient these include bleeding bruising and infection temporary or permanent paralysis. The patient elects to proceed and has given written consent.  Patient placed prone on fluoroscopy table. Area marked and prepped with Betadine. Fluoroscopic images utilized to guide needle. 25-gauge 1.5 inch needle was used to anesthetize 5 injection points with 2 cc of 1% lidocaine each. Then a 18-gauge 10 cm RF needle with a 10 mm curved active tip was inserted under fluoroscopic guidance first targeting the S1 SA P./sacral ala junction, bone contact made and confirmed with lateral imaging.  motor stimulation at 2 Hz confirm proper needle location followed by injection of 38ml of 1% lidocaine MPF. Then the L5 SAP/ transverse process junction was targeted. Bone contact was made. Needle tip confirmed on lateral imaging.  Motor stimulation at 2 Hz confirmed proper needle location followed by injection of one ML of the1% lidocaine solution. Radio frequency 80C for 90 seconds was performed. Then the infero- lateral aspect of the S1, S2 and lateral aspect of S3 sacral foramina were targeted. Bone contact made.  Motor stim at 2 Hz confirmed proper needle location. One ML of the lidocaine solution was injected into each of 3 sites and radio frequency ablation 80C for 90 seconds was performed. Patient tolerated procedure well. Post procedure instructions given

## 2018-01-21 NOTE — Progress Notes (Signed)
  PROCEDURE RECORD  Physical Medicine and Rehabilitation   Name: Denise [name] DOB:04-21-1951 MRN: [id number]  Date:01/21/2018  Physician: Alysia Penna, MD    Nurse/CMA: Bright CMA   Allergies:  Allergies  Allergen Reactions  . Adhesive [Tape] Other (See Comments)    Blisters.  Paper tape okay as long removed as soon as possible.   . Sulfa Antibiotics Hives  . Dulaglutide Diarrhea and Other (See Comments)    Stomach pain (reaction to Trulicity)  . Keflex [Cephalexin] Rash  . Pioglitazone Other (See Comments)    Dizziness /high anxiety (reaction to Actos)    Consent Signed: Yes.    Is patient diabetic? Yes.    CBG today? 150   Pregnant: No. LMP: No LMP recorded. Patient is postmenopausal. (age 29-55)  Anticoagulants: no Anti-inflammatory: yes (naproxen, last dose last AM) Antibiotics: no  Procedure: Left L5, S1-3 Radio Frequency neurotomy Position: Prone   Start Time: 350pm End Time: 415pm Fluoro Time: 59s  RN/CMA Bright CMA Bright CMA    Time 316pm 422pm    BP 117/73 122/79    Pulse 95 91    Respirations 16 16    O2 Sat 94 91    S/S 6 6    Pain Level 4/10 1/10     D/C home with Husband, patient A & O X 3, D/C instructions reviewed, and sits independently.

## 2018-02-16 ENCOUNTER — Other Ambulatory Visit: Payer: Self-pay | Admitting: Physical Medicine & Rehabilitation

## 2018-02-18 DIAGNOSIS — E1165 Type 2 diabetes mellitus with hyperglycemia: Secondary | ICD-10-CM | POA: Diagnosis not present

## 2018-02-18 DIAGNOSIS — E782 Mixed hyperlipidemia: Secondary | ICD-10-CM | POA: Diagnosis not present

## 2018-02-19 ENCOUNTER — Encounter: Payer: Medicare Other | Attending: Physical Medicine & Rehabilitation

## 2018-02-19 ENCOUNTER — Ambulatory Visit: Payer: BC Managed Care – PPO | Admitting: Physical Medicine & Rehabilitation

## 2018-02-19 ENCOUNTER — Ambulatory Visit (HOSPITAL_BASED_OUTPATIENT_CLINIC_OR_DEPARTMENT_OTHER): Payer: BC Managed Care – PPO | Admitting: Physical Medicine & Rehabilitation

## 2018-02-19 ENCOUNTER — Encounter: Payer: Self-pay | Admitting: Physical Medicine & Rehabilitation

## 2018-02-19 VITALS — BP 136/77 | HR 78 | Resp 14 | Ht 67.0 in | Wt 262.0 lb

## 2018-02-19 DIAGNOSIS — M1612 Unilateral primary osteoarthritis, left hip: Secondary | ICD-10-CM | POA: Diagnosis not present

## 2018-02-19 DIAGNOSIS — M797 Fibromyalgia: Secondary | ICD-10-CM | POA: Insufficient documentation

## 2018-02-19 DIAGNOSIS — G8929 Other chronic pain: Secondary | ICD-10-CM | POA: Diagnosis not present

## 2018-02-19 DIAGNOSIS — M533 Sacrococcygeal disorders, not elsewhere classified: Secondary | ICD-10-CM

## 2018-02-19 DIAGNOSIS — M722 Plantar fascial fibromatosis: Secondary | ICD-10-CM

## 2018-02-19 DIAGNOSIS — M545 Low back pain: Secondary | ICD-10-CM | POA: Diagnosis not present

## 2018-02-19 NOTE — Patient Instructions (Signed)
If your left foot pain was relieved with sacroiliac injection , you may have Pseudosciatica rather than true sciatica  Numbness in feet likely from diabetes.  We can confirm this with EMG if you'd like to schedule.
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
## 2018-02-21 DIAGNOSIS — Z6841 Body Mass Index (BMI) 40.0 and over, adult: Secondary | ICD-10-CM | POA: Diagnosis not present

## 2018-02-21 DIAGNOSIS — I10 Essential (primary) hypertension: Secondary | ICD-10-CM | POA: Diagnosis not present

## 2018-02-21 DIAGNOSIS — E559 Vitamin D deficiency, unspecified: Secondary | ICD-10-CM | POA: Diagnosis not present

## 2018-02-21 DIAGNOSIS — Z7984 Long term (current) use of oral hypoglycemic drugs: Secondary | ICD-10-CM | POA: Diagnosis not present

## 2018-02-21 DIAGNOSIS — Z79899 Other long term (current) drug therapy: Secondary | ICD-10-CM | POA: Diagnosis not present

## 2018-02-21 DIAGNOSIS — E782 Mixed hyperlipidemia: Secondary | ICD-10-CM | POA: Diagnosis not present

## 2018-02-21 DIAGNOSIS — E1165 Type 2 diabetes mellitus with hyperglycemia: Secondary | ICD-10-CM | POA: Diagnosis not present

## 2018-02-21 DIAGNOSIS — E669 Obesity, unspecified: Secondary | ICD-10-CM | POA: Diagnosis not present

## 2018-02-25 DIAGNOSIS — H16223 Keratoconjunctivitis sicca, not specified as Sjogren's, bilateral: Secondary | ICD-10-CM | POA: Diagnosis not present

## 2018-02-25 DIAGNOSIS — H0012 Chalazion right lower eyelid: Secondary | ICD-10-CM | POA: Diagnosis not present

## 2018-03-08 DIAGNOSIS — N3020 Other chronic cystitis without hematuria: Secondary | ICD-10-CM | POA: Diagnosis not present

## 2018-03-08 DIAGNOSIS — N3946 Mixed incontinence: Secondary | ICD-10-CM | POA: Diagnosis not present

## 2018-04-19 ENCOUNTER — Other Ambulatory Visit: Payer: Self-pay | Admitting: Physical Medicine & Rehabilitation

## 2018-04-19 DIAGNOSIS — R350 Frequency of micturition: Secondary | ICD-10-CM | POA: Diagnosis not present

## 2018-04-19 DIAGNOSIS — N3942 Incontinence without sensory awareness: Secondary | ICD-10-CM | POA: Diagnosis not present

## 2018-04-19 DIAGNOSIS — N3020 Other chronic cystitis without hematuria: Secondary | ICD-10-CM | POA: Diagnosis not present

## 2018-04-30 DIAGNOSIS — N3946 Mixed incontinence: Secondary | ICD-10-CM | POA: Diagnosis not present

## 2018-04-30 DIAGNOSIS — N200 Calculus of kidney: Secondary | ICD-10-CM | POA: Diagnosis not present

## 2018-05-27 DIAGNOSIS — E1165 Type 2 diabetes mellitus with hyperglycemia: Secondary | ICD-10-CM | POA: Diagnosis not present

## 2018-05-27 DIAGNOSIS — E782 Mixed hyperlipidemia: Secondary | ICD-10-CM | POA: Diagnosis not present

## 2018-05-31 DIAGNOSIS — N3020 Other chronic cystitis without hematuria: Secondary | ICD-10-CM | POA: Diagnosis not present

## 2018-05-31 DIAGNOSIS — N3946 Mixed incontinence: Secondary | ICD-10-CM | POA: Diagnosis not present

## 2018-06-11 DIAGNOSIS — I10 Essential (primary) hypertension: Secondary | ICD-10-CM | POA: Diagnosis not present

## 2018-06-11 DIAGNOSIS — Z79899 Other long term (current) drug therapy: Secondary | ICD-10-CM | POA: Diagnosis not present

## 2018-06-11 DIAGNOSIS — E559 Vitamin D deficiency, unspecified: Secondary | ICD-10-CM | POA: Diagnosis not present

## 2018-06-11 DIAGNOSIS — E1165 Type 2 diabetes mellitus with hyperglycemia: Secondary | ICD-10-CM | POA: Diagnosis not present

## 2018-06-11 DIAGNOSIS — E1129 Type 2 diabetes mellitus with other diabetic kidney complication: Secondary | ICD-10-CM | POA: Diagnosis not present

## 2018-06-11 DIAGNOSIS — R809 Proteinuria, unspecified: Secondary | ICD-10-CM | POA: Diagnosis not present

## 2018-06-11 DIAGNOSIS — E782 Mixed hyperlipidemia: Secondary | ICD-10-CM | POA: Diagnosis not present

## 2018-06-11 DIAGNOSIS — Z23 Encounter for immunization: Secondary | ICD-10-CM | POA: Diagnosis not present

## 2018-06-24 DIAGNOSIS — Z124 Encounter for screening for malignant neoplasm of cervix: Secondary | ICD-10-CM | POA: Diagnosis not present

## 2018-06-24 DIAGNOSIS — Z6841 Body Mass Index (BMI) 40.0 and over, adult: Secondary | ICD-10-CM | POA: Diagnosis not present

## 2018-06-24 DIAGNOSIS — Z779 Other contact with and (suspected) exposures hazardous to health: Secondary | ICD-10-CM | POA: Diagnosis not present

## 2018-06-24 DIAGNOSIS — Z1231 Encounter for screening mammogram for malignant neoplasm of breast: Secondary | ICD-10-CM | POA: Diagnosis not present

## 2018-08-07 DIAGNOSIS — M5126 Other intervertebral disc displacement, lumbar region: Secondary | ICD-10-CM | POA: Diagnosis not present

## 2018-08-07 DIAGNOSIS — M4126 Other idiopathic scoliosis, lumbar region: Secondary | ICD-10-CM | POA: Diagnosis not present

## 2018-08-07 DIAGNOSIS — I10 Essential (primary) hypertension: Secondary | ICD-10-CM | POA: Diagnosis not present

## 2018-08-07 DIAGNOSIS — M545 Low back pain: Secondary | ICD-10-CM | POA: Diagnosis not present

## 2018-08-07 DIAGNOSIS — Z6839 Body mass index (BMI) 39.0-39.9, adult: Secondary | ICD-10-CM | POA: Diagnosis not present

## 2018-08-07 DIAGNOSIS — M5416 Radiculopathy, lumbar region: Secondary | ICD-10-CM | POA: Diagnosis not present

## 2018-08-13 ENCOUNTER — Other Ambulatory Visit: Payer: Self-pay | Admitting: Neurosurgery

## 2018-08-13 DIAGNOSIS — M4126 Other idiopathic scoliosis, lumbar region: Secondary | ICD-10-CM

## 2018-08-19 DIAGNOSIS — L723 Sebaceous cyst: Secondary | ICD-10-CM | POA: Diagnosis not present

## 2018-08-27 ENCOUNTER — Ambulatory Visit
Admission: RE | Admit: 2018-08-27 | Discharge: 2018-08-27 | Disposition: A | Discharge status: Home / Self Care | Payer: BC Managed Care – PPO | Source: Ambulatory Visit | Attending: Neurosurgery | Admitting: Neurosurgery

## 2018-08-27 DIAGNOSIS — M48061 Spinal stenosis, lumbar region without neurogenic claudication: Secondary | ICD-10-CM | POA: Diagnosis not present

## 2018-08-27 DIAGNOSIS — M4126 Other idiopathic scoliosis, lumbar region: Secondary | ICD-10-CM

## 2018-09-12 DIAGNOSIS — R05 Cough: Secondary | ICD-10-CM | POA: Diagnosis not present

## 2018-09-12 DIAGNOSIS — J189 Pneumonia, unspecified organism: Secondary | ICD-10-CM | POA: Diagnosis not present

## 2018-09-12 DIAGNOSIS — R509 Fever, unspecified: Secondary | ICD-10-CM | POA: Diagnosis not present

## 2018-09-12 DIAGNOSIS — R0789 Other chest pain: Secondary | ICD-10-CM | POA: Diagnosis not present

## 2018-09-17 DIAGNOSIS — J189 Pneumonia, unspecified organism: Secondary | ICD-10-CM | POA: Diagnosis not present

## 2018-10-04 DIAGNOSIS — E782 Mixed hyperlipidemia: Secondary | ICD-10-CM | POA: Diagnosis not present

## 2018-10-04 DIAGNOSIS — Z79899 Other long term (current) drug therapy: Secondary | ICD-10-CM | POA: Diagnosis not present

## 2018-10-04 DIAGNOSIS — E1165 Type 2 diabetes mellitus with hyperglycemia: Secondary | ICD-10-CM | POA: Diagnosis not present

## 2018-10-08 DIAGNOSIS — E782 Mixed hyperlipidemia: Secondary | ICD-10-CM | POA: Diagnosis not present

## 2018-10-08 DIAGNOSIS — E559 Vitamin D deficiency, unspecified: Secondary | ICD-10-CM | POA: Diagnosis not present

## 2018-10-08 DIAGNOSIS — I10 Essential (primary) hypertension: Secondary | ICD-10-CM | POA: Diagnosis not present

## 2018-10-08 DIAGNOSIS — E1165 Type 2 diabetes mellitus with hyperglycemia: Secondary | ICD-10-CM | POA: Diagnosis not present

## 2018-10-08 DIAGNOSIS — Z79899 Other long term (current) drug therapy: Secondary | ICD-10-CM | POA: Diagnosis not present

## 2018-11-26 DIAGNOSIS — K635 Polyp of colon: Secondary | ICD-10-CM | POA: Diagnosis not present

## 2018-11-26 DIAGNOSIS — K648 Other hemorrhoids: Secondary | ICD-10-CM | POA: Diagnosis not present

## 2018-11-26 DIAGNOSIS — Z1211 Encounter for screening for malignant neoplasm of colon: Secondary | ICD-10-CM | POA: Diagnosis not present

## 2018-11-26 DIAGNOSIS — Z800 Family history of malignant neoplasm of digestive organs: Secondary | ICD-10-CM | POA: Diagnosis not present

## 2018-11-26 DIAGNOSIS — D124 Benign neoplasm of descending colon: Secondary | ICD-10-CM | POA: Diagnosis not present

## 2019-01-28 DIAGNOSIS — Z03818 Encounter for observation for suspected exposure to other biological agents ruled out: Secondary | ICD-10-CM | POA: Diagnosis not present

## 2019-02-07 DIAGNOSIS — E559 Vitamin D deficiency, unspecified: Secondary | ICD-10-CM | POA: Diagnosis not present

## 2019-02-07 DIAGNOSIS — E1165 Type 2 diabetes mellitus with hyperglycemia: Secondary | ICD-10-CM | POA: Diagnosis not present

## 2019-02-07 DIAGNOSIS — E782 Mixed hyperlipidemia: Secondary | ICD-10-CM | POA: Diagnosis not present

## 2019-02-11 DIAGNOSIS — E559 Vitamin D deficiency, unspecified: Secondary | ICD-10-CM | POA: Diagnosis not present

## 2019-02-11 DIAGNOSIS — Z79899 Other long term (current) drug therapy: Secondary | ICD-10-CM | POA: Diagnosis not present

## 2019-02-11 DIAGNOSIS — E782 Mixed hyperlipidemia: Secondary | ICD-10-CM | POA: Diagnosis not present

## 2019-02-11 DIAGNOSIS — I10 Essential (primary) hypertension: Secondary | ICD-10-CM | POA: Diagnosis not present

## 2019-02-11 DIAGNOSIS — E1165 Type 2 diabetes mellitus with hyperglycemia: Secondary | ICD-10-CM | POA: Diagnosis not present

## 2019-06-04 DIAGNOSIS — E1165 Type 2 diabetes mellitus with hyperglycemia: Secondary | ICD-10-CM | POA: Diagnosis not present

## 2019-06-04 DIAGNOSIS — E782 Mixed hyperlipidemia: Secondary | ICD-10-CM | POA: Diagnosis not present

## 2019-06-04 DIAGNOSIS — K648 Other hemorrhoids: Secondary | ICD-10-CM | POA: Diagnosis not present

## 2019-06-10 DIAGNOSIS — E782 Mixed hyperlipidemia: Secondary | ICD-10-CM | POA: Diagnosis not present

## 2019-06-10 DIAGNOSIS — I10 Essential (primary) hypertension: Secondary | ICD-10-CM | POA: Diagnosis not present

## 2019-06-10 DIAGNOSIS — Z79899 Other long term (current) drug therapy: Secondary | ICD-10-CM | POA: Diagnosis not present

## 2019-06-10 DIAGNOSIS — E559 Vitamin D deficiency, unspecified: Secondary | ICD-10-CM | POA: Diagnosis not present

## 2019-06-10 DIAGNOSIS — E1165 Type 2 diabetes mellitus with hyperglycemia: Secondary | ICD-10-CM | POA: Diagnosis not present

## 2019-06-20 DIAGNOSIS — H52203 Unspecified astigmatism, bilateral: Secondary | ICD-10-CM | POA: Diagnosis not present

## 2019-06-20 DIAGNOSIS — H04123 Dry eye syndrome of bilateral lacrimal glands: Secondary | ICD-10-CM | POA: Diagnosis not present

## 2019-06-20 DIAGNOSIS — H2513 Age-related nuclear cataract, bilateral: Secondary | ICD-10-CM | POA: Diagnosis not present

## 2019-06-25 DIAGNOSIS — K648 Other hemorrhoids: Secondary | ICD-10-CM | POA: Diagnosis not present

## 2019-07-16 DIAGNOSIS — K648 Other hemorrhoids: Secondary | ICD-10-CM | POA: Diagnosis not present

## 2019-07-24 DIAGNOSIS — N3946 Mixed incontinence: Secondary | ICD-10-CM | POA: Diagnosis not present

## 2019-07-24 DIAGNOSIS — N3020 Other chronic cystitis without hematuria: Secondary | ICD-10-CM | POA: Diagnosis not present

## 2019-08-07 DIAGNOSIS — N3642 Intrinsic sphincter deficiency (ISD): Secondary | ICD-10-CM | POA: Diagnosis not present

## 2019-08-13 DIAGNOSIS — M8588 Other specified disorders of bone density and structure, other site: Secondary | ICD-10-CM | POA: Diagnosis not present

## 2019-08-13 DIAGNOSIS — R42 Dizziness and giddiness: Secondary | ICD-10-CM | POA: Diagnosis not present

## 2019-08-13 DIAGNOSIS — N952 Postmenopausal atrophic vaginitis: Secondary | ICD-10-CM | POA: Diagnosis not present

## 2019-08-13 DIAGNOSIS — Z1231 Encounter for screening mammogram for malignant neoplasm of breast: Secondary | ICD-10-CM | POA: Diagnosis not present

## 2019-08-13 DIAGNOSIS — Z6838 Body mass index (BMI) 38.0-38.9, adult: Secondary | ICD-10-CM | POA: Diagnosis not present

## 2019-08-13 DIAGNOSIS — Z01419 Encounter for gynecological examination (general) (routine) without abnormal findings: Secondary | ICD-10-CM | POA: Diagnosis not present

## 2019-08-13 DIAGNOSIS — N958 Other specified menopausal and perimenopausal disorders: Secondary | ICD-10-CM | POA: Diagnosis not present

## 2019-08-22 DIAGNOSIS — G4733 Obstructive sleep apnea (adult) (pediatric): Secondary | ICD-10-CM | POA: Diagnosis not present

## 2019-08-22 DIAGNOSIS — F419 Anxiety disorder, unspecified: Secondary | ICD-10-CM | POA: Diagnosis not present

## 2019-08-22 DIAGNOSIS — R0981 Nasal congestion: Secondary | ICD-10-CM | POA: Diagnosis not present

## 2019-08-22 DIAGNOSIS — R42 Dizziness and giddiness: Secondary | ICD-10-CM | POA: Diagnosis not present

## 2019-09-02 DIAGNOSIS — N3020 Other chronic cystitis without hematuria: Secondary | ICD-10-CM | POA: Diagnosis not present

## 2019-10-19 ENCOUNTER — Ambulatory Visit: Payer: Medicare Other

## 2019-10-24 ENCOUNTER — Ambulatory Visit: Payer: Medicare Other

## 2019-10-26 ENCOUNTER — Ambulatory Visit: Payer: Medicare Other
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
## 2019-10-31 ENCOUNTER — Other Ambulatory Visit: Payer: Self-pay

## 2019-10-31 ENCOUNTER — Encounter: Payer: Self-pay | Admitting: Cardiology

## 2019-10-31 ENCOUNTER — Telehealth: Payer: Self-pay | Admitting: *Deleted

## 2019-10-31 ENCOUNTER — Telehealth: Payer: Self-pay

## 2019-10-31 ENCOUNTER — Telehealth (INDEPENDENT_AMBULATORY_CARE_PROVIDER_SITE_OTHER): Payer: Medicare Other | Admitting: Cardiology

## 2019-10-31 VITALS — BP 124/80 | HR 84 | Ht 67.0 in | Wt 241.0 lb

## 2019-10-31 DIAGNOSIS — I10 Essential (primary) hypertension: Secondary | ICD-10-CM | POA: Diagnosis not present

## 2019-10-31 DIAGNOSIS — E119 Type 2 diabetes mellitus without complications: Secondary | ICD-10-CM

## 2019-10-31 DIAGNOSIS — G4733 Obstructive sleep apnea (adult) (pediatric): Secondary | ICD-10-CM

## 2019-10-31 DIAGNOSIS — E669 Obesity, unspecified: Secondary | ICD-10-CM | POA: Diagnosis not present

## 2019-10-31 NOTE — Telephone Encounter (Signed)
YOUR CARDIOLOGY TEAM HAS ARRANGED FOR AN E-VISIT FOR YOUR APPOINTMENT - PLEASE REVIEW IMPORTANT INFORMATION BELOW SEVERAL DAYS PRIOR TO YOUR APPOINTMENT  Due to the recent COVID-19 pandemic, we are transitioning in-person office visits to tele-medicine visits in an effort to decrease unnecessary exposure to our patients, their families, and staff. These visits are billed to your insurance just like a normal visit is. We also encourage you to sign up for MyChart if you have not already done so. You will need a smartphone if possible. For patients that do not have this, we can still complete the visit using a regular telephone but do prefer a smartphone to enable video when possible. You may have a family member that lives with you that can help. If possible, we also ask that you have a blood pressure cuff and scale at home to measure your blood pressure, heart rate and weight prior to your scheduled appointment. Patients with clinical needs that need an in-person evaluation and testing will still be able to come to the office if absolutely necessary. If you have any questions, feel free to call our office.   THE DAY OF YOUR APPOINTMENT  Approximately 15 minutes prior to your scheduled appointment, you will receive a telephone call from one of [facility] team - your caller ID may say "Unknown caller."  Our staff will confirm medications, vital signs for the day and any symptoms you may be experiencing. Please have this information available prior to the time of visit start. It may also be helpful for you to have a pad of paper and pen handy for any instructions given during your visit. They will also walk you through joining the smartphone meeting if this is a video visit.    CONSENT FOR TELE-HEALTH VISIT - PLEASE REVIEW  I hereby voluntarily request, consent and authorize [facility] and its employed or contracted physicians, physician assistants, nurse practitioners or other licensed health care  professionals (the Practitioner), to provide me with telemedicine health care services (the "Services") as deemed necessary by the treating Practitioner. I acknowledge and consent to receive the Services by the Practitioner via telemedicine. I understand that the telemedicine visit will involve communicating with the Practitioner through live audiovisual communication technology and the disclosure of certain medical information by electronic transmission. I acknowledge that I have been given the opportunity to request an in-person assessment or other available alternative prior to the telemedicine visit and am voluntarily participating in the telemedicine visit.  I understand that I have the right to withhold or withdraw my consent to the use of telemedicine in the course of my care at any time, without affecting my right to future care or treatment, and that the Practitioner or I may terminate the telemedicine visit at any time. I understand that I have the right to inspect all information obtained and/or recorded in the course of the telemedicine visit and may receive copies of available information for a reasonable fee.  I understand that some of the potential risks of receiving the Services via telemedicine include:  Marland Kitchen Delay or interruption in medical evaluation due to technological equipment failure or disruption; . Information transmitted may not be sufficient (e.g. poor resolution of images) to allow for appropriate medical decision making by the Practitioner; and/or  . In rare instances, security protocols could fail, causing a breach of personal health information.  Furthermore, I acknowledge that it is my responsibility to provide information about my medical history, conditions and care that is complete and  accurate to the best of my ability. I acknowledge that Practitioner's advice, recommendations, and/or decision may be based on factors not within their control, such as incomplete or inaccurate  data provided by me or distortions of diagnostic images or specimens that may result from electronic transmissions. I understand that the practice of medicine is not an exact science and that Practitioner makes no warranties or guarantees regarding treatment outcomes. I acknowledge that I will receive a copy of this consent concurrently upon execution via email to the email address I last provided but may also request a printed copy by calling the office of [facility].    I understand that my insurance will be billed for this visit.   I have read or had this consent read to me. . I understand the contents of this consent, which adequately explains the benefits and risks of the Services being provided via telemedicine.  . I have been provided ample opportunity to ask questions regarding this consent and the Services and have had my questions answered to my satisfaction. . I give my informed consent for the services to be provided through the use of telemedicine in my medical care  By participating in this telemedicine visit I agree to the above.

## 2019-10-31 NOTE — Telephone Encounter (Signed)
Order placed to [employer]  °

## 2019-10-31 NOTE — Telephone Encounter (Signed)
Denise [name] - please order ResMed CPAP at 10cm H2O with heated humidity and Resmed Airfit P30i mask and order supplies and  followup with me 10 weeks later

## 2020-01-15 DIAGNOSIS — E1165 Type 2 diabetes mellitus with hyperglycemia: Secondary | ICD-10-CM | POA: Diagnosis not present

## 2020-01-15 DIAGNOSIS — E782 Mixed hyperlipidemia: Secondary | ICD-10-CM | POA: Diagnosis not present

## 2020-01-22 DIAGNOSIS — Z79899 Other long term (current) drug therapy: Secondary | ICD-10-CM | POA: Diagnosis not present

## 2020-01-22 DIAGNOSIS — E782 Mixed hyperlipidemia: Secondary | ICD-10-CM | POA: Diagnosis not present

## 2020-01-22 DIAGNOSIS — E1165 Type 2 diabetes mellitus with hyperglycemia: Secondary | ICD-10-CM | POA: Diagnosis not present

## 2020-01-22 DIAGNOSIS — E559 Vitamin D deficiency, unspecified: Secondary | ICD-10-CM | POA: Diagnosis not present

## 2020-01-22 DIAGNOSIS — I10 Essential (primary) hypertension: Secondary | ICD-10-CM | POA: Diagnosis not present

## 2020-01-27 DIAGNOSIS — R0989 Other specified symptoms and signs involving the circulatory and respiratory systems: Secondary | ICD-10-CM | POA: Diagnosis not present

## 2020-01-27 DIAGNOSIS — F419 Anxiety disorder, unspecified: Secondary | ICD-10-CM | POA: Diagnosis not present

## 2020-01-27 DIAGNOSIS — Z8249 Family history of ischemic heart disease and other diseases of the circulatory system: Secondary | ICD-10-CM | POA: Diagnosis not present

## 2020-02-11 ENCOUNTER — Other Ambulatory Visit (HOSPITAL_COMMUNITY): Payer: Self-pay | Admitting: Family Medicine

## 2020-02-11 DIAGNOSIS — R0989 Other specified symptoms and signs involving the circulatory and respiratory systems: Secondary | ICD-10-CM

## 2020-02-17 ENCOUNTER — Other Ambulatory Visit: Payer: Self-pay

## 2020-02-17 ENCOUNTER — Ambulatory Visit (HOSPITAL_COMMUNITY)
Admission: RE | Admit: 2020-02-17 | Discharge: 2020-02-17 | Disposition: A | Discharge status: Home / Self Care | Payer: Medicare Other | Source: Ambulatory Visit | Attending: Family Medicine | Admitting: Family Medicine

## 2020-02-17 DIAGNOSIS — R0989 Other specified symptoms and signs involving the circulatory and respiratory systems: Secondary | ICD-10-CM | POA: Diagnosis not present

## 2020-02-17 NOTE — Progress Notes (Signed)
ABI has been completed.   Preliminary results in CV Proc.   [name] 02/17/2020 9:24 AM

## 2020-04-13 DIAGNOSIS — R350 Frequency of micturition: Secondary | ICD-10-CM | POA: Diagnosis not present

## 2020-04-13 DIAGNOSIS — N3020 Other chronic cystitis without hematuria: Secondary | ICD-10-CM | POA: Diagnosis not present

## 2020-04-13 DIAGNOSIS — N3946 Mixed incontinence: Secondary | ICD-10-CM | POA: Diagnosis not present

## 2020-05-25 DIAGNOSIS — H43812 Vitreous degeneration, left eye: Secondary | ICD-10-CM | POA: Diagnosis not present

## 2020-06-17 DIAGNOSIS — N3020 Other chronic cystitis without hematuria: Secondary | ICD-10-CM | POA: Diagnosis not present

## 2020-06-17 DIAGNOSIS — Z23 Encounter for immunization: Secondary | ICD-10-CM | POA: Diagnosis not present

## 2020-06-17 DIAGNOSIS — N200 Calculus of kidney: Secondary | ICD-10-CM | POA: Diagnosis not present

## 2020-06-17 DIAGNOSIS — N3946 Mixed incontinence: Secondary | ICD-10-CM | POA: Diagnosis not present

## 2020-07-21 DIAGNOSIS — Z23 Encounter for immunization: Secondary | ICD-10-CM | POA: Diagnosis not present

## 2020-07-26 DIAGNOSIS — N3020 Other chronic cystitis without hematuria: Secondary | ICD-10-CM | POA: Diagnosis not present

## 2020-07-26 DIAGNOSIS — N3946 Mixed incontinence: Secondary | ICD-10-CM | POA: Diagnosis not present

## 2020-07-26 DIAGNOSIS — N200 Calculus of kidney: Secondary | ICD-10-CM | POA: Diagnosis not present

## 2020-11-10 DIAGNOSIS — Z6838 Body mass index (BMI) 38.0-38.9, adult: Secondary | ICD-10-CM | POA: Diagnosis not present

## 2020-11-10 DIAGNOSIS — N952 Postmenopausal atrophic vaginitis: Secondary | ICD-10-CM | POA: Diagnosis not present

## 2020-11-10 DIAGNOSIS — Z124 Encounter for screening for malignant neoplasm of cervix: Secondary | ICD-10-CM | POA: Diagnosis not present

## 2020-11-10 DIAGNOSIS — N950 Postmenopausal bleeding: Secondary | ICD-10-CM | POA: Diagnosis not present

## 2020-11-10 DIAGNOSIS — Z1231 Encounter for screening mammogram for malignant neoplasm of breast: Secondary | ICD-10-CM | POA: Diagnosis not present

## 2020-11-16 DIAGNOSIS — E782 Mixed hyperlipidemia: Secondary | ICD-10-CM | POA: Diagnosis not present

## 2020-11-16 DIAGNOSIS — E1165 Type 2 diabetes mellitus with hyperglycemia: Secondary | ICD-10-CM | POA: Diagnosis not present

## 2020-11-16 DIAGNOSIS — Z79899 Other long term (current) drug therapy: Secondary | ICD-10-CM | POA: Diagnosis not present

## 2020-11-19 DIAGNOSIS — E782 Mixed hyperlipidemia: Secondary | ICD-10-CM | POA: Diagnosis not present

## 2020-11-19 DIAGNOSIS — I10 Essential (primary) hypertension: Secondary | ICD-10-CM | POA: Diagnosis not present

## 2020-11-19 DIAGNOSIS — E119 Type 2 diabetes mellitus without complications: Secondary | ICD-10-CM | POA: Diagnosis not present

## 2020-11-19 DIAGNOSIS — E559 Vitamin D deficiency, unspecified: Secondary | ICD-10-CM | POA: Diagnosis not present

## 2020-11-19 DIAGNOSIS — G4733 Obstructive sleep apnea (adult) (pediatric): Secondary | ICD-10-CM | POA: Diagnosis not present

## 2020-11-19 DIAGNOSIS — Z7984 Long term (current) use of oral hypoglycemic drugs: Secondary | ICD-10-CM | POA: Diagnosis not present

## 2020-11-26 DIAGNOSIS — N950 Postmenopausal bleeding: Secondary | ICD-10-CM | POA: Diagnosis not present

## 2020-12-20 ENCOUNTER — Other Ambulatory Visit: Payer: Self-pay | Admitting: Obstetrics and Gynecology

## 2020-12-20 DIAGNOSIS — N950 Postmenopausal bleeding: Secondary | ICD-10-CM | POA: Diagnosis not present

## 2020-12-20 DIAGNOSIS — N840 Polyp of corpus uteri: Secondary | ICD-10-CM | POA: Diagnosis not present

## 2021-01-07 DIAGNOSIS — J0100 Acute maxillary sinusitis, unspecified: Secondary | ICD-10-CM | POA: Diagnosis not present

## 2021-01-10 DIAGNOSIS — Z0000 Encounter for general adult medical examination without abnormal findings: Secondary | ICD-10-CM | POA: Diagnosis not present

## 2021-01-20 DIAGNOSIS — M5430 Sciatica, unspecified side: Secondary | ICD-10-CM | POA: Diagnosis not present

## 2021-01-20 DIAGNOSIS — M797 Fibromyalgia: Secondary | ICD-10-CM | POA: Diagnosis not present

## 2021-01-20 DIAGNOSIS — E559 Vitamin D deficiency, unspecified: Secondary | ICD-10-CM | POA: Diagnosis not present

## 2021-01-20 DIAGNOSIS — E785 Hyperlipidemia, unspecified: Secondary | ICD-10-CM | POA: Diagnosis not present

## 2021-01-20 DIAGNOSIS — Z79899 Other long term (current) drug therapy: Secondary | ICD-10-CM | POA: Diagnosis not present

## 2021-01-20 DIAGNOSIS — G4733 Obstructive sleep apnea (adult) (pediatric): Secondary | ICD-10-CM | POA: Diagnosis not present

## 2021-01-20 DIAGNOSIS — E1169 Type 2 diabetes mellitus with other specified complication: Secondary | ICD-10-CM | POA: Diagnosis not present

## 2021-02-28 ENCOUNTER — Other Ambulatory Visit: Payer: Self-pay

## 2021-02-28 ENCOUNTER — Emergency Department (HOSPITAL_BASED_OUTPATIENT_CLINIC_OR_DEPARTMENT_OTHER): Payer: Medicare Other

## 2021-02-28 ENCOUNTER — Emergency Department (HOSPITAL_BASED_OUTPATIENT_CLINIC_OR_DEPARTMENT_OTHER)
Admission: EM | Admit: 2021-02-28 | Discharge: 2021-02-28 | Disposition: A | Discharge status: Home / Self Care | Payer: Medicare Other | Attending: Emergency Medicine | Admitting: Emergency Medicine

## 2021-02-28 ENCOUNTER — Encounter (HOSPITAL_BASED_OUTPATIENT_CLINIC_OR_DEPARTMENT_OTHER): Payer: Self-pay | Admitting: *Deleted

## 2021-02-28 DIAGNOSIS — M549 Dorsalgia, unspecified: Secondary | ICD-10-CM | POA: Diagnosis not present

## 2021-02-28 DIAGNOSIS — Z96659 Presence of unspecified artificial knee joint: Secondary | ICD-10-CM | POA: Diagnosis not present

## 2021-02-28 DIAGNOSIS — F0781 Postconcussional syndrome: Secondary | ICD-10-CM

## 2021-02-28 DIAGNOSIS — E119 Type 2 diabetes mellitus without complications: Secondary | ICD-10-CM | POA: Insufficient documentation

## 2021-02-28 DIAGNOSIS — S199XXA Unspecified injury of neck, initial encounter: Secondary | ICD-10-CM | POA: Diagnosis present

## 2021-02-28 DIAGNOSIS — Z7984 Long term (current) use of oral hypoglycemic drugs: Secondary | ICD-10-CM | POA: Diagnosis not present

## 2021-02-28 DIAGNOSIS — Z85828 Personal history of other malignant neoplasm of skin: Secondary | ICD-10-CM | POA: Insufficient documentation

## 2021-02-28 DIAGNOSIS — S134XXA Sprain of ligaments of cervical spine, initial encounter: Secondary | ICD-10-CM | POA: Insufficient documentation

## 2021-02-28 DIAGNOSIS — W01198A Fall on same level from slipping, tripping and stumbling with subsequent striking against other object, initial encounter: Secondary | ICD-10-CM | POA: Insufficient documentation

## 2021-02-28 DIAGNOSIS — I10 Essential (primary) hypertension: Secondary | ICD-10-CM | POA: Insufficient documentation

## 2021-02-28 DIAGNOSIS — Z79899 Other long term (current) drug therapy: Secondary | ICD-10-CM | POA: Diagnosis not present

## 2021-02-28 DIAGNOSIS — S139XXA Sprain of joints and ligaments of unspecified parts of neck, initial encounter: Secondary | ICD-10-CM

## 2021-02-28 DIAGNOSIS — R519 Headache, unspecified: Secondary | ICD-10-CM | POA: Diagnosis not present

## 2021-02-28 DIAGNOSIS — M542 Cervicalgia: Secondary | ICD-10-CM | POA: Diagnosis not present

## 2021-02-28 MED ORDER — ACETAMINOPHEN 500 MG PO TABS
1000.0000 mg | ORAL_TABLET | Freq: Once | ORAL | Status: AC
  Administered 2021-02-28: 1000 mg via ORAL
  Filled 2021-02-28: qty 2
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
## 2021-02-28 NOTE — ED Triage Notes (Addendum)
Fell Friday pm, sat in chair that fell backwards  hit head on swing set and the ground,  states loc briefly  c/o back of head and back pain,  not on blood thinners

## 2021-02-28 NOTE — ED Notes (Signed)
Pt states occipital head pain after fall backwards from chair to ground Friday evening.  States head hit hard clay ground.  No bleeding, no obvious hematomas noted

## 2021-03-21 DIAGNOSIS — J0140 Acute pansinusitis, unspecified: Secondary | ICD-10-CM | POA: Diagnosis not present

## 2021-03-21 DIAGNOSIS — E139 Other specified diabetes mellitus without complications: Secondary | ICD-10-CM | POA: Diagnosis not present

## 2021-03-28 DIAGNOSIS — R42 Dizziness and giddiness: Secondary | ICD-10-CM | POA: Diagnosis not present

## 2021-03-28 DIAGNOSIS — J019 Acute sinusitis, unspecified: Secondary | ICD-10-CM | POA: Diagnosis not present

## 2021-05-19 ENCOUNTER — Other Ambulatory Visit: Payer: Self-pay | Admitting: Family Medicine

## 2021-05-19 ENCOUNTER — Other Ambulatory Visit (HOSPITAL_COMMUNITY): Payer: Self-pay | Admitting: Family Medicine

## 2021-05-19 DIAGNOSIS — R519 Headache, unspecified: Secondary | ICD-10-CM | POA: Diagnosis not present

## 2021-05-19 DIAGNOSIS — R251 Tremor, unspecified: Secondary | ICD-10-CM | POA: Diagnosis not present

## 2021-05-19 DIAGNOSIS — R269 Unspecified abnormalities of gait and mobility: Secondary | ICD-10-CM | POA: Diagnosis not present

## 2021-05-24 DIAGNOSIS — E119 Type 2 diabetes mellitus without complications: Secondary | ICD-10-CM | POA: Diagnosis not present

## 2021-05-24 DIAGNOSIS — H5213 Myopia, bilateral: Secondary | ICD-10-CM | POA: Diagnosis not present

## 2021-05-24 DIAGNOSIS — H2513 Age-related nuclear cataract, bilateral: Secondary | ICD-10-CM | POA: Diagnosis not present

## 2021-06-08 ENCOUNTER — Ambulatory Visit (HOSPITAL_BASED_OUTPATIENT_CLINIC_OR_DEPARTMENT_OTHER)
Admission: RE | Admit: 2021-06-08 | Discharge: 2021-06-08 | Disposition: A | Discharge status: Home / Self Care | Payer: Medicare Other | Source: Ambulatory Visit | Attending: Family Medicine | Admitting: Family Medicine

## 2021-06-08 ENCOUNTER — Other Ambulatory Visit: Payer: Self-pay

## 2021-06-08 DIAGNOSIS — R519 Headache, unspecified: Secondary | ICD-10-CM | POA: Diagnosis not present

## 2021-06-08 DIAGNOSIS — R251 Tremor, unspecified: Secondary | ICD-10-CM

## 2021-06-08 DIAGNOSIS — R269 Unspecified abnormalities of gait and mobility: Secondary | ICD-10-CM

## 2021-06-09 ENCOUNTER — Ambulatory Visit (HOSPITAL_BASED_OUTPATIENT_CLINIC_OR_DEPARTMENT_OTHER): Payer: BC Managed Care – PPO

## 2021-06-09 DIAGNOSIS — D485 Neoplasm of uncertain behavior of skin: Secondary | ICD-10-CM | POA: Diagnosis not present

## 2021-06-09 DIAGNOSIS — F0781 Postconcussional syndrome: Secondary | ICD-10-CM | POA: Diagnosis not present

## 2021-06-09 DIAGNOSIS — Z23 Encounter for immunization: Secondary | ICD-10-CM | POA: Diagnosis not present

## 2021-06-09 DIAGNOSIS — G44309 Post-traumatic headache, unspecified, not intractable: Secondary | ICD-10-CM | POA: Diagnosis not present

## 2021-06-09 DIAGNOSIS — E1169 Type 2 diabetes mellitus with other specified complication: Secondary | ICD-10-CM | POA: Diagnosis not present

## 2021-06-29 DIAGNOSIS — J019 Acute sinusitis, unspecified: Secondary | ICD-10-CM | POA: Diagnosis not present

## 2021-07-26 DIAGNOSIS — N200 Calculus of kidney: Secondary | ICD-10-CM | POA: Diagnosis not present

## 2021-07-26 DIAGNOSIS — N3946 Mixed incontinence: Secondary | ICD-10-CM | POA: Diagnosis not present

## 2021-07-26 DIAGNOSIS — N3020 Other chronic cystitis without hematuria: Secondary | ICD-10-CM | POA: Diagnosis not present

## 2021-07-27 DIAGNOSIS — D225 Melanocytic nevi of trunk: Secondary | ICD-10-CM | POA: Diagnosis not present

## 2021-07-27 DIAGNOSIS — C44519 Basal cell carcinoma of skin of other part of trunk: Secondary | ICD-10-CM | POA: Diagnosis not present

## 2021-07-27 DIAGNOSIS — D485 Neoplasm of uncertain behavior of skin: Secondary | ICD-10-CM | POA: Diagnosis not present

## 2021-07-27 DIAGNOSIS — L732 Hidradenitis suppurativa: Secondary | ICD-10-CM | POA: Diagnosis not present

## 2021-07-27 DIAGNOSIS — L538 Other specified erythematous conditions: Secondary | ICD-10-CM | POA: Diagnosis not present

## 2021-07-27 DIAGNOSIS — Z789 Other specified health status: Secondary | ICD-10-CM | POA: Diagnosis not present

## 2021-07-27 DIAGNOSIS — D2262 Melanocytic nevi of left upper limb, including shoulder: Secondary | ICD-10-CM | POA: Diagnosis not present

## 2021-07-27 DIAGNOSIS — L820 Inflamed seborrheic keratosis: Secondary | ICD-10-CM | POA: Diagnosis not present

## 2021-07-27 DIAGNOSIS — R208 Other disturbances of skin sensation: Secondary | ICD-10-CM | POA: Diagnosis not present

## 2021-07-28 DIAGNOSIS — F0781 Postconcussional syndrome: Secondary | ICD-10-CM | POA: Diagnosis not present

## 2021-07-28 DIAGNOSIS — E785 Hyperlipidemia, unspecified: Secondary | ICD-10-CM | POA: Diagnosis not present

## 2021-07-28 DIAGNOSIS — E1169 Type 2 diabetes mellitus with other specified complication: Secondary | ICD-10-CM | POA: Diagnosis not present

## 2021-07-28 DIAGNOSIS — E559 Vitamin D deficiency, unspecified: Secondary | ICD-10-CM | POA: Diagnosis not present

## 2021-07-28 DIAGNOSIS — I10 Essential (primary) hypertension: Secondary | ICD-10-CM | POA: Diagnosis not present

## 2021-07-28 DIAGNOSIS — N200 Calculus of kidney: Secondary | ICD-10-CM | POA: Diagnosis not present

## 2021-08-03 ENCOUNTER — Ambulatory Visit (INDEPENDENT_AMBULATORY_CARE_PROVIDER_SITE_OTHER): Payer: Medicare Other | Admitting: Neurology

## 2021-08-03 ENCOUNTER — Encounter: Payer: Self-pay | Admitting: Neurology

## 2021-08-03 ENCOUNTER — Telehealth: Payer: Self-pay | Admitting: Neurology

## 2021-08-03 VITALS — Ht 66.0 in | Wt 250.2 lb

## 2021-08-03 DIAGNOSIS — H9202 Otalgia, left ear: Secondary | ICD-10-CM

## 2021-08-03 DIAGNOSIS — H833X3 Noise effects on inner ear, bilateral: Secondary | ICD-10-CM | POA: Diagnosis not present

## 2021-08-03 DIAGNOSIS — R251 Tremor, unspecified: Secondary | ICD-10-CM

## 2021-08-03 DIAGNOSIS — R42 Dizziness and giddiness: Secondary | ICD-10-CM | POA: Diagnosis not present

## 2021-08-03 DIAGNOSIS — G250 Essential tremor: Secondary | ICD-10-CM | POA: Diagnosis not present

## 2021-08-03 DIAGNOSIS — H9313 Tinnitus, bilateral: Secondary | ICD-10-CM

## 2021-08-03 DIAGNOSIS — Z9989 Dependence on other enabling machines and devices: Secondary | ICD-10-CM | POA: Diagnosis not present

## 2021-08-03 DIAGNOSIS — R419 Unspecified symptoms and signs involving cognitive functions and awareness: Secondary | ICD-10-CM

## 2021-08-03 DIAGNOSIS — G4733 Obstructive sleep apnea (adult) (pediatric): Secondary | ICD-10-CM

## 2021-08-03 DIAGNOSIS — R519 Headache, unspecified: Secondary | ICD-10-CM
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
## 2021-08-03 NOTE — Patient Instructions (Signed)
It was nice to meet you today.  I am glad to hear that you are feeling better, it can take weeks or months after a fall to have improvement in symptoms.  It is not unusual to have a headache or dizziness or cognitive complaints after a head injury.  It is possible that this is all part of a postconcussion syndrome and that you are improving slowly.  Please remember, common headache triggers are: sleep deprivation, dehydration, overheating, stress, hypoglycemia or skipping meals and blood sugar fluctuations, excessive pain medications or excessive alcohol use or caffeine withdrawal. Some people have food triggers such as aged cheese, orange juice or chocolate, especially dark chocolate, or MSG (monosodium glutamate). Try to avoid these headache triggers as much possible. It may be helpful to keep a headache diary to figure out what makes your headaches worse or brings them on and what alleviates them. Some people report headache onset after exercise but studies have shown that regular exercise may actually prevent headaches from coming. If you have exercise-induced headaches, please make sure that you drink plenty of fluid before and after exercising and that you do not over do it and do not overheat.  Unfortunately, dizziness is a very common complaint but is often not due to a primary neurological reason or single underlying medical problem. Often, there a combination of factors, that result in dizziness. This includes blood pressure fluctuations, medication side effects, blood sugar fluctuations, stress, vertigo, poor sleep with sleep deprivation, dehydration, and electrolyte disturbance or other metabolic and endocrinological reasons, meaning hormone related problems such as thyroid dysfunction. We will investigate things further with a brain MRI. We will call you with the test results.   You have a rather mild and intermittent tremor of both hands, more noticeable on the right and a mild head tremor.  I  do not see any signs or symptoms of parkinson's like disease or what we call parkinsonism.   For your tremor, I would not recommend any new medications.  Since you are on a low-dose of metoprolol, you can talk to your primary care about increasing it to 50 mg once daily, it may help your headaches and your tremor but if you feel you are stable on the current low-dose, it may not need to increase it then.   We do not have to make a follow up appointment.  So long as your brain MRI shows age-appropriate findings and no acute changes, I would be happy to see you back in this clinic on an as-needed basis.   If you have persistent ringing in the ears and sensitivity to sound or changes in your hearing, please talk to your primary care about a referral to an ENT specialist.    Please remember, that any kind of tremor may be exacerbated by anxiety, anger, nervousness, excitement, dehydration, sleep deprivation, by caffeine, and low blood sugar values or blood sugar fluctuations. Some medications can exacerbate tremors, this includes certain antidepressant medications.

## 2021-08-03 NOTE — Telephone Encounter (Signed)
Medicare/BCBS Josem Kaufmann: [id number] (exp. 08/03/21 to 09/01/21) order sent to GI, they will reach out to the patient to schedule.

## 2021-08-10 DIAGNOSIS — L538 Other specified erythematous conditions: Secondary | ICD-10-CM | POA: Diagnosis not present

## 2021-08-10 DIAGNOSIS — Z789 Other specified health status: Secondary | ICD-10-CM | POA: Diagnosis not present

## 2021-08-10 DIAGNOSIS — L298 Other pruritus: Secondary | ICD-10-CM | POA: Diagnosis not present

## 2021-08-10 DIAGNOSIS — L820 Inflamed seborrheic keratosis: Secondary | ICD-10-CM | POA: Diagnosis not present

## 2021-08-10 DIAGNOSIS — R208 Other disturbances of skin sensation: Secondary | ICD-10-CM | POA: Diagnosis not present

## 2021-08-25 DIAGNOSIS — R42 Dizziness and giddiness: Secondary | ICD-10-CM | POA: Diagnosis not present

## 2021-08-25 DIAGNOSIS — R519 Headache, unspecified: Secondary | ICD-10-CM | POA: Diagnosis not present

## 2021-08-26 ENCOUNTER — Ambulatory Visit
Admission: RE | Admit: 2021-08-26 | Discharge: 2021-08-26 | Disposition: A | Discharge status: Home / Self Care | Payer: Medicare Other | Source: Ambulatory Visit | Attending: Neurology | Admitting: Neurology

## 2021-08-26 ENCOUNTER — Other Ambulatory Visit: Payer: Self-pay

## 2021-08-26 DIAGNOSIS — R251 Tremor, unspecified: Secondary | ICD-10-CM

## 2021-08-26 DIAGNOSIS — H9313 Tinnitus, bilateral: Secondary | ICD-10-CM

## 2021-08-26 DIAGNOSIS — G4733 Obstructive sleep apnea (adult) (pediatric): Secondary | ICD-10-CM

## 2021-08-26 DIAGNOSIS — G250 Essential tremor: Secondary | ICD-10-CM

## 2021-08-26 DIAGNOSIS — Z9989 Dependence on other enabling machines and devices: Secondary | ICD-10-CM

## 2021-08-26 DIAGNOSIS — H833X3 Noise effects on inner ear, bilateral: Secondary | ICD-10-CM

## 2021-08-26 DIAGNOSIS — R519 Headache, unspecified: Secondary | ICD-10-CM | POA: Diagnosis not present

## 2021-08-26 DIAGNOSIS — H9202 Otalgia, left ear: Secondary | ICD-10-CM

## 2021-08-26 DIAGNOSIS — R42 Dizziness and giddiness: Secondary | ICD-10-CM

## 2021-08-26 DIAGNOSIS — R419 Unspecified symptoms and signs involving cognitive functions and awareness: Secondary | ICD-10-CM

## 2021-08-26 MED ORDER — GADOBENATE DIMEGLUMINE 529 MG/ML IV SOLN
20.0000 mL | Freq: Once | INTRAVENOUS | Status: AC | PRN
  Administered 2021-08-26: 20 mL via INTRAVENOUS

## 2021-08-29 ENCOUNTER — Telehealth: Payer: Self-pay | Admitting: *Deleted

## 2021-08-29 ENCOUNTER — Encounter: Payer: Self-pay | Admitting: *Deleted

## 2021-08-29 NOTE — Telephone Encounter (Signed)
error 

## 2021-08-29 NOTE — Telephone Encounter (Addendum)
Called pt and LVM (ok per DPR) advising patient of MRI brain results as noted in the detailed message below by Dr Rexene Alberts. Left office number for pt to call back if she has any questions. Advised f/u with PCP.    ----- Message from Star Age, MD sent at 08/29/2021 12:56 PM EST ----- Please call patient and advise her that her recent brain MRI did not show any acute findings, mild and chronic changes were seen, some evidence of sinus issues with one of the nasal sinuses, these changes were seen on his CT scan from September as well.  If she has nasal congestion and sinus problems including sinus drainage, she is advised to talk to her primary care physician about referral to ENT.  Overall, reassuring brain scan, no specific signs to explain headaches or dizziness.  As discussed, she can follow-up with her primary care.

## 2022-02-07 ENCOUNTER — Encounter: Payer: Self-pay | Admitting: Cardiology

## 2022-03-02 ENCOUNTER — Encounter: Payer: Self-pay | Admitting: Cardiology

## 2022-03-02 ENCOUNTER — Ambulatory Visit (INDEPENDENT_AMBULATORY_CARE_PROVIDER_SITE_OTHER): Payer: Medicare PPO | Admitting: Cardiology

## 2022-03-02 VITALS — BP 112/70 | HR 90 | Ht 66.0 in | Wt 234.8 lb

## 2022-03-02 DIAGNOSIS — R079 Chest pain, unspecified: Secondary | ICD-10-CM

## 2022-03-02 DIAGNOSIS — G4733 Obstructive sleep apnea (adult) (pediatric): Secondary | ICD-10-CM

## 2022-03-02 DIAGNOSIS — Z01812 Encounter for preprocedural laboratory examination: Secondary | ICD-10-CM

## 2022-03-02 DIAGNOSIS — I10 Essential (primary) hypertension: Secondary | ICD-10-CM | POA: Diagnosis not present

## 2022-03-02 LAB — BASIC METABOLIC PANEL
BUN/Creatinine Ratio: 24 (ref 12–28)
BUN: 17 mg/dL (ref 8–27)
CO2: 24 mmol/L (ref 20–29)
Calcium: 10.2 mg/dL (ref 8.7–10.3)
Chloride: 101 mmol/L (ref 96–106)
Creatinine, Ser: 0.72 mg/dL (ref 0.57–1.00)
Glucose: 113 mg/dL — ABNORMAL HIGH (ref 70–99)
Potassium: 4.1 mmol/L (ref 3.5–5.2)
Sodium: 143 mmol/L (ref 134–144)
eGFR: 89 mL/min/{1.73_m2} (ref >59)

## 2022-03-02 MED ORDER — METOPROLOL TARTRATE 100 MG PO TABS: 100.0000 mg | ORAL_TABLET | ORAL | 0 refills | Status: DC

## 2022-03-02 NOTE — Addendum Note (Signed)
Addended by: Shellia Cleverly on: 03/02/2022 08:37 AM   Modules accepted: Orders
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
## 2022-03-02 NOTE — Patient Instructions (Signed)
Medication Instructions:  The current medical regimen is effective;  continue present plan and medications.  *If you need a refill on your cardiac medications before your next appointment, please call your pharmacy*   Lab Work: Please have blood work today (BMP)  If you have labs (blood work) drawn today and your tests are completely normal, you will receive your results only by: MyChart Message (if you have MyChart) OR A paper copy in the mail If you have any lab test that is abnormal or we need to change your treatment, we will call you to review the results.   Testing/Procedures:   Your cardiac CT will be scheduled at:   [facility] [address] [place], [place] [address] [phone]  Please arrive at the [facility] and Children's Entrance (Entrance C2) of [facility] 30 minutes prior to test start time. You can use the FREE valet parking offered at entrance C (encouraged to control the heart rate for the test)  Proceed to the [facility] Radiology Department (first floor) to check-in and test prep.  All radiology patients and guests should use entrance C2 at [facility], accessed from [facility], even though the hospital's physical address listed is [address].     Please follow these instructions carefully (unless otherwise directed):  On the Night Before the Test: Be sure to Drink plenty of water. Do not consume any caffeinated/decaffeinated beverages or chocolate 12 hours prior to your test. Do not take any antihistamines 12 hours prior to your test.  On the Day of the Test: Drink plenty of water until 1 hour prior to the test. Do not eat any food 4 hours prior to the test. You may take your regular medications prior to the test.  Take metoprolol (Lopressor) two hours prior to test. HOLD Furosemide/Hydrochlorothiazide morning of the test. FEMALES- please wear underwire-free bra if available, avoid dresses &  tight clothing  After the Test: Drink plenty of water. After receiving IV contrast, you may experience a mild flushed feeling. This is normal. On occasion, you may experience a mild rash up to 24 hours after the test. This is not dangerous. If this occurs, you can take Benadryl 25 mg and increase your fluid intake. If you experience trouble breathing, this can be serious. If it is severe call 911 IMMEDIATELY. If it is mild, please call our office. If you take any of these medications: Glipizide/Metformin, Avandament, Glucavance, please do not take 48 hours after completing test unless otherwise instructed.  We will call to schedule your test 2-4 weeks out understanding that some insurance companies will need an authorization prior to the service being performed.   For non-scheduling related questions, please contact the cardiac imaging nurse navigator should you have any questions/concerns: Marchia Bond, Cardiac Imaging Nurse Navigator Gordy Clement, Cardiac Imaging Nurse Navigator [facility] Direct Office Dial: [phone]   For scheduling needs, including cancellations and rescheduling, please call [place], [phone].  Follow-Up: At [facility], you and your health needs are our priority.  As part of our continuing mission to provide you with exceptional heart care, we have created designated Provider Care Teams.  These Care Teams include your primary Cardiologist (physician) and Advanced Practice Providers (APPs -  Physician Assistants and Nurse Practitioners) who all work together to provide you with the care you need, when you need it.  We recommend signing up for the patient portal called "MyChart".  Sign up information  is provided on this After Visit Summary.  MyChart is used to connect with patients for Virtual Visits (Telemedicine).  Patients are able to view lab/test results, encounter notes, upcoming appointments, etc.  Non-urgent messages can be  sent to your provider as well.   To learn more about what you can do with MyChart, go to [email or web address].    Your next appointment:   1 year(s)  The format for your next appointment:   In Person  Provider:   Fransico Him, MD {   Important Information About Sugar

## 2022-03-03 ENCOUNTER — Telehealth: Payer: Self-pay | Admitting: *Deleted

## 2022-03-03 DIAGNOSIS — G4733 Obstructive sleep apnea (adult) (pediatric): Secondary | ICD-10-CM

## 2022-03-03 NOTE — Telephone Encounter (Signed)
-----   Message from Shellia Cleverly, RN sent at 03/02/2022  8:47 AM EDT ----- Regarding: chin strap Per Dr Radford Pax - pt needs a chin strap  Thank you

## 2022-03-03 NOTE — Telephone Encounter (Signed)
Order placed to [employer] via community message. 

## 2022-03-22 DIAGNOSIS — M5416 Radiculopathy, lumbar region: Secondary | ICD-10-CM | POA: Diagnosis not present

## 2022-03-22 DIAGNOSIS — M542 Cervicalgia: Secondary | ICD-10-CM | POA: Diagnosis not present

## 2022-04-03 DIAGNOSIS — M542 Cervicalgia: Secondary | ICD-10-CM | POA: Diagnosis not present

## 2022-04-03 DIAGNOSIS — M5416 Radiculopathy, lumbar region: Secondary | ICD-10-CM | POA: Diagnosis not present

## 2022-04-14 ENCOUNTER — Encounter (HOSPITAL_COMMUNITY): Payer: Self-pay

## 2022-05-09 DIAGNOSIS — R82998 Other abnormal findings in urine: Secondary | ICD-10-CM | POA: Diagnosis not present

## 2022-05-12 ENCOUNTER — Telehealth (HOSPITAL_COMMUNITY): Payer: Self-pay | Admitting: *Deleted

## 2022-05-12 ENCOUNTER — Other Ambulatory Visit (HOSPITAL_COMMUNITY): Payer: Self-pay | Admitting: *Deleted

## 2022-05-12 ENCOUNTER — Other Ambulatory Visit (HOSPITAL_COMMUNITY): Payer: Self-pay

## 2022-05-12 DIAGNOSIS — Z01812 Encounter for preprocedural laboratory examination: Secondary | ICD-10-CM

## 2022-05-12 MED ORDER — IVABRADINE HCL 5 MG PO TABS
ORAL_TABLET | ORAL | 0 refills | Status: DC
  Filled 2022-05-12: qty 3, 1d supply, fill #0

## 2022-05-12 NOTE — Telephone Encounter (Signed)
Reaching out to patient to offer assistance regarding upcoming cardiac imaging study; pt verbalizes understanding of appt date/time, parking situation and where to check in, pre-test NPO status and medications ordered, and verified current allergies; name and call back number provided for further questions should they arise  Gordy Clement RN Navigator Cardiac [facility] [phone] office [phone] cell  Patient confirmed her BP is low and HR is high. Pt will take '15mg'$  ivabradine two hours prior to her cardiac CT scan. She is aware to obtain labs prior to appt and to arrive at 11am for her CT scan.

## 2022-05-12 NOTE — Telephone Encounter (Signed)
Attempted to call patient regarding upcoming cardiac CT appointment. °Left message on voicemail with name and callback number ° °Suzan Manon RN Navigator Cardiac Imaging °[facility] °[phone] Office °[phone] Cell ° °

## 2022-05-15 ENCOUNTER — Ambulatory Visit: Payer: Medicare PPO | Attending: Cardiology

## 2022-05-15 DIAGNOSIS — Z01812 Encounter for preprocedural laboratory examination: Secondary | ICD-10-CM | POA: Diagnosis not present

## 2022-05-16 ENCOUNTER — Encounter: Payer: Self-pay | Admitting: Cardiology

## 2022-05-16 ENCOUNTER — Ambulatory Visit (HOSPITAL_COMMUNITY)
Admission: RE | Admit: 2022-05-16 | Discharge: 2022-05-16 | Disposition: A | Discharge status: Home / Self Care | Payer: Medicare PPO | Source: Ambulatory Visit | Attending: Cardiology | Admitting: Cardiology

## 2022-05-16 ENCOUNTER — Telehealth: Payer: Self-pay

## 2022-05-16 ENCOUNTER — Other Ambulatory Visit: Payer: Self-pay | Admitting: Cardiology

## 2022-05-16 ENCOUNTER — Ambulatory Visit (HOSPITAL_BASED_OUTPATIENT_CLINIC_OR_DEPARTMENT_OTHER)
Admission: RE | Admit: 2022-05-16 | Discharge: 2022-05-16 | Disposition: A | Discharge status: Home / Self Care | Payer: Medicare PPO | Source: Ambulatory Visit | Attending: Cardiology | Admitting: Cardiology

## 2022-05-16 DIAGNOSIS — K449 Diaphragmatic hernia without obstruction or gangrene: Secondary | ICD-10-CM | POA: Insufficient documentation

## 2022-05-16 DIAGNOSIS — R079 Chest pain, unspecified: Secondary | ICD-10-CM

## 2022-05-16 DIAGNOSIS — R931 Abnormal findings on diagnostic imaging of heart and coronary circulation: Secondary | ICD-10-CM | POA: Diagnosis not present

## 2022-05-16 DIAGNOSIS — I10 Essential (primary) hypertension: Secondary | ICD-10-CM | POA: Insufficient documentation

## 2022-05-16 DIAGNOSIS — I2510 Atherosclerotic heart disease of native coronary artery without angina pectoris: Secondary | ICD-10-CM | POA: Diagnosis not present

## 2022-05-16 DIAGNOSIS — I700 Atherosclerosis of aorta: Secondary | ICD-10-CM | POA: Diagnosis not present

## 2022-05-16 LAB — BASIC METABOLIC PANEL
BUN/Creatinine Ratio: 22 (ref 12–28)
BUN: 16 mg/dL (ref 8–27)
CO2: 24 mmol/L (ref 20–29)
Calcium: 10 mg/dL (ref 8.7–10.3)
Chloride: 102 mmol/L (ref 96–106)
Creatinine, Ser: 0.73 mg/dL (ref 0.57–1.00)
Glucose: 127 mg/dL — ABNORMAL HIGH (ref 70–99)
Potassium: 4.1 mmol/L (ref 3.5–5.2)
Sodium: 141 mmol/L (ref 134–144)
eGFR: 87 mL/min/{1.73_m2} (ref >59)

## 2022-05-16 MED ORDER — METOPROLOL SUCCINATE ER 25 MG PO TB24: 12.5000 mg | ORAL_TABLET | Freq: Every day | ORAL | 3 refills | Status: DC

## 2022-05-16 MED ORDER — IOHEXOL 350 MG/ML SOLN
100.0000 mL | Freq: Once | INTRAVENOUS | Status: AC | PRN
  Administered 2022-05-16: 100 mL via INTRAVENOUS

## 2022-05-16 MED ORDER — METOPROLOL TARTRATE 5 MG/5ML IV SOLN
5.0000 mg | INTRAVENOUS | Status: DC | PRN
  Administered 2022-05-16: 5 mg via INTRAVENOUS

## 2022-05-16 MED ORDER — NITROGLYCERIN 0.4 MG SL SUBL
SUBLINGUAL_TABLET | SUBLINGUAL | Status: AC
  Filled 2022-05-16: qty 2

## 2022-05-16 MED ORDER — METOPROLOL TARTRATE 5 MG/5ML IV SOLN
INTRAVENOUS | Status: AC
  Filled 2022-05-16: qty 10

## 2022-05-16 MED ORDER — NITROGLYCERIN 0.4 MG SL SUBL
0.8000 mg | SUBLINGUAL_TABLET | Freq: Once | SUBLINGUAL | Status: AC
  Administered 2022-05-16: 0.8 mg via SUBLINGUAL

## 2022-05-16 MED ORDER — ASPIRIN 81 MG PO TBEC: 81.0000 mg | DELAYED_RELEASE_TABLET | Freq: Every day | ORAL | 3 refills | Status: AC

## 2022-05-16 NOTE — Telephone Encounter (Signed)
The patient has been notified of the result and verbalized understanding.  All questions (if any) were answered. Denise Iba, RN 05/16/2022 4:57 PM  Prescription has been sent in for Toprol 12.5 mg daily. She will start taking ASA 81 mg daily. Follow up appointment has been scheduled. She thinks that she had her cholesterol checked recently so will call back and let us know.

## 2022-05-16 NOTE — Telephone Encounter (Signed)
Sueanne Margarita, MD  05/16/2022  2:26 PM EDT     Noncoronary portion of coronary CT showed a hiatal hernia please forward this to PCP   Sueanne Margarita, MD  05/16/2022  2:24 PM EDT     Coronary CTA showed coronary calcium score 78.1 which is 50 percentile for age, sex and race matched controls plaque in the proximal LAD with 44 to 69% stenosis.  FFR pending.  Start aspirin 81 mg daily.  Please have her come in for fasting lipid panel and ALT

## 2022-05-16 NOTE — Telephone Encounter (Signed)
-----   Message from Sueanne Margarita, MD sent at 05/16/2022  2:25 PM EDT ----- Mid LAD lesion is not flow-limiting.  Continue medical management.   add on Toprol-XL 12.5 mg daily for antianginal therapy and follow-up with PA in 4 weeks.

## 2022-05-17 DIAGNOSIS — G629 Polyneuropathy, unspecified: Secondary | ICD-10-CM | POA: Diagnosis not present

## 2022-05-17 DIAGNOSIS — M797 Fibromyalgia: Secondary | ICD-10-CM | POA: Diagnosis not present

## 2022-05-17 DIAGNOSIS — M5432 Sciatica, left side: Secondary | ICD-10-CM | POA: Diagnosis not present

## 2022-05-17 DIAGNOSIS — E669 Obesity, unspecified: Secondary | ICD-10-CM | POA: Diagnosis not present

## 2022-05-17 DIAGNOSIS — M1991 Primary osteoarthritis, unspecified site: Secondary | ICD-10-CM | POA: Diagnosis not present

## 2022-05-17 DIAGNOSIS — M79641 Pain in right hand: Secondary | ICD-10-CM | POA: Diagnosis not present

## 2022-05-17 DIAGNOSIS — Z6838 Body mass index (BMI) 38.0-38.9, adult: Secondary | ICD-10-CM | POA: Diagnosis not present

## 2022-05-17 DIAGNOSIS — M79642 Pain in left hand: Secondary | ICD-10-CM | POA: Diagnosis not present

## 2022-05-17 DIAGNOSIS — M5431 Sciatica, right side: Secondary | ICD-10-CM | POA: Diagnosis not present
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
## 2022-06-09 IMAGING — CT CT HEAD W/O CM
4 series · 17 of 47 positions shown, 19 images · non-contrast
Comparison: 02/28/2021

CLINICAL DATA: Recurrent headaches

EXAM:
CT HEAD WITHOUT CONTRAST
TECHNIQUE: Contiguous axial images were obtained from the base of the skull
through the vertex without intravenous contrast.

[Series 2: head wo · axial · 0.43mm/px · z∈[-90,+30]mm · 7 of 32 slices shown, 9 images]
[im 4/32  brain]
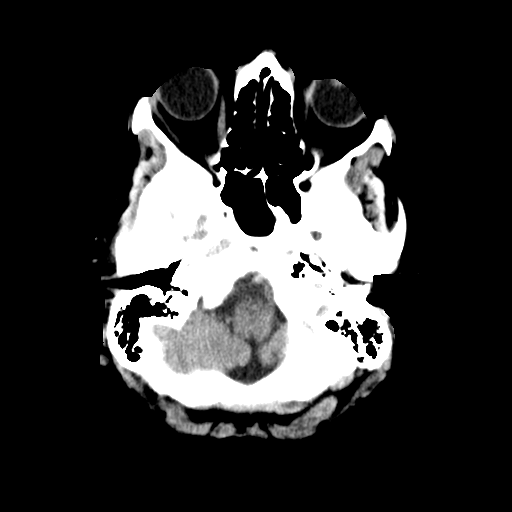
[im 4/32  bone]
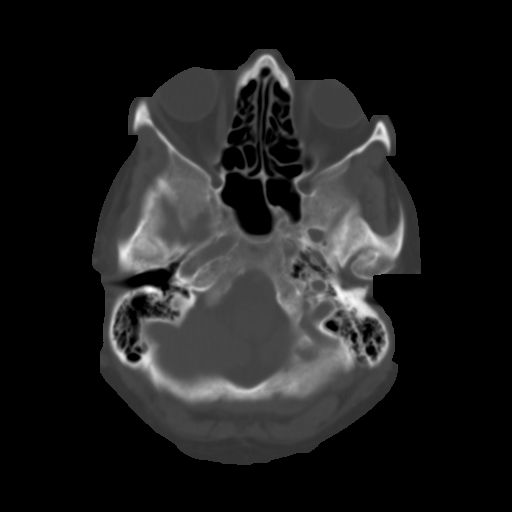
[im 8/32  brain]
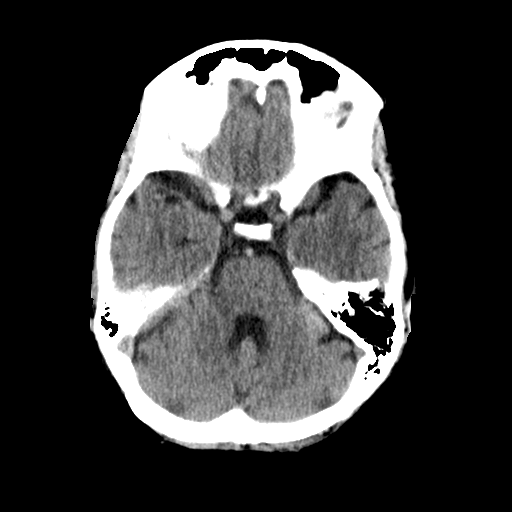
[im 12/32  brain]
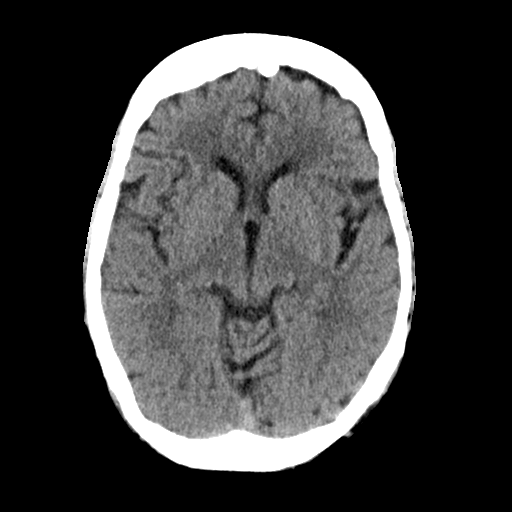
[im 16/32  brain]
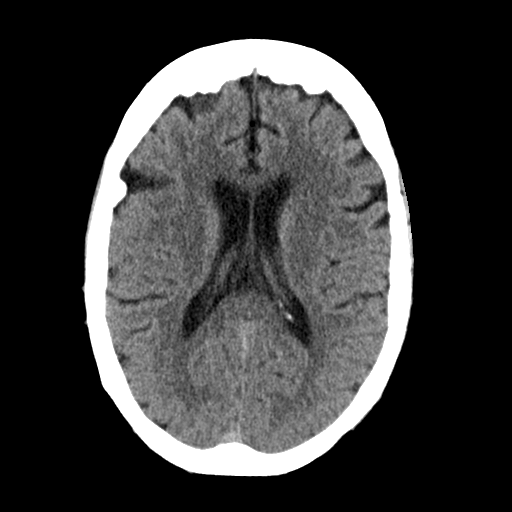
[im 20/32  brain]
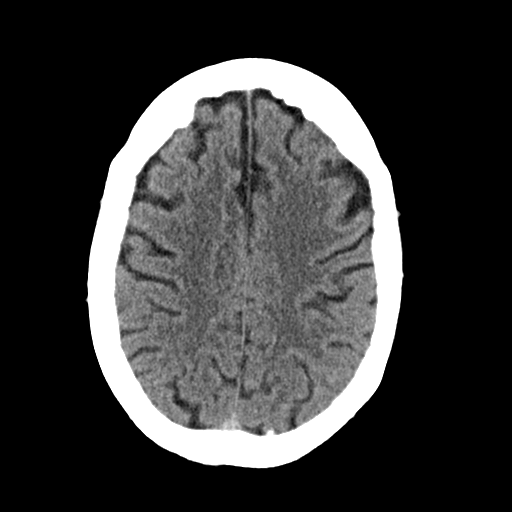
[im 20/32  bone]
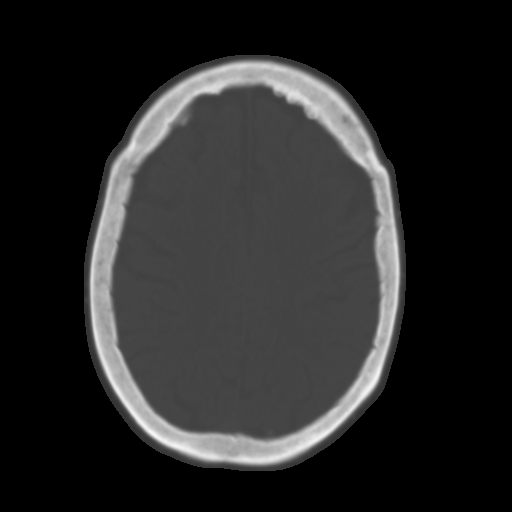
[im 24/32  brain]
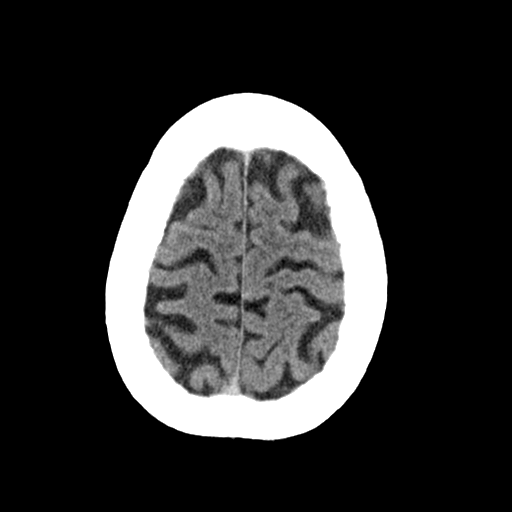
[im 28/32  brain]
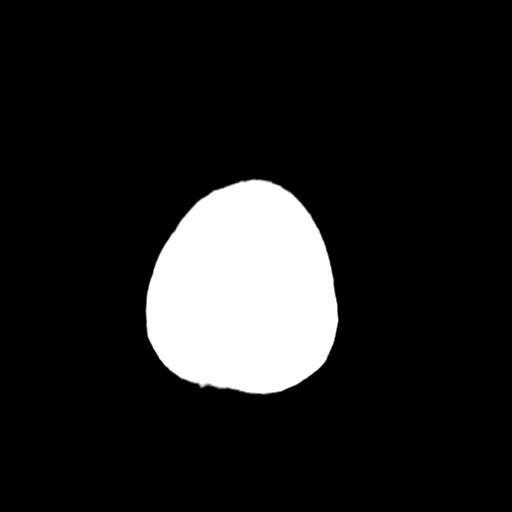

[Series 3: head bone · axial · 0.43mm/px · z∈[-91,-37]mm · 4 of 78 slices shown]
[im 8/78  bone]
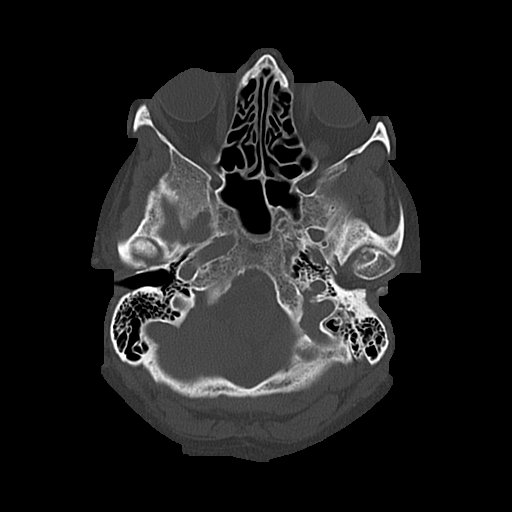
[im 16/78  bone]
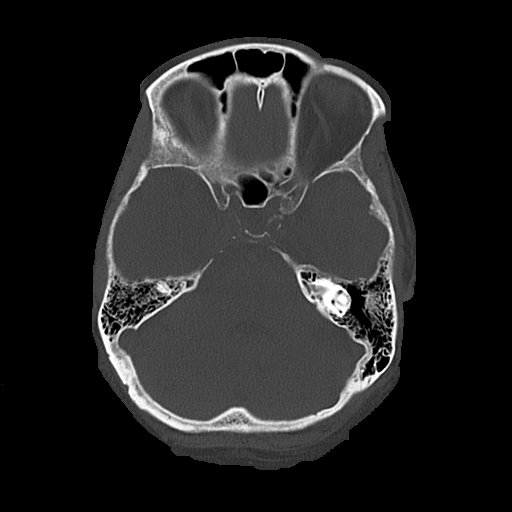
[im 24/78  bone]
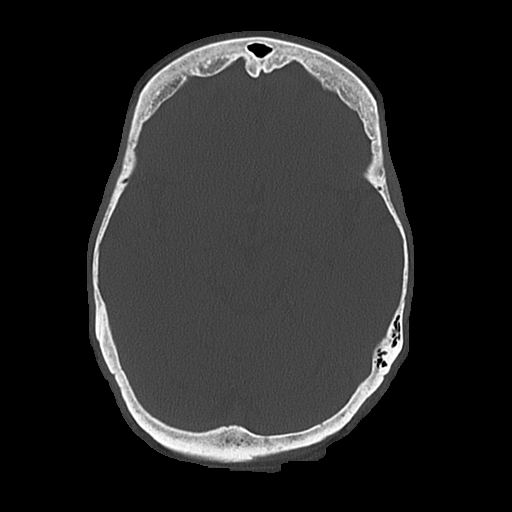
[im 35/78  bone]
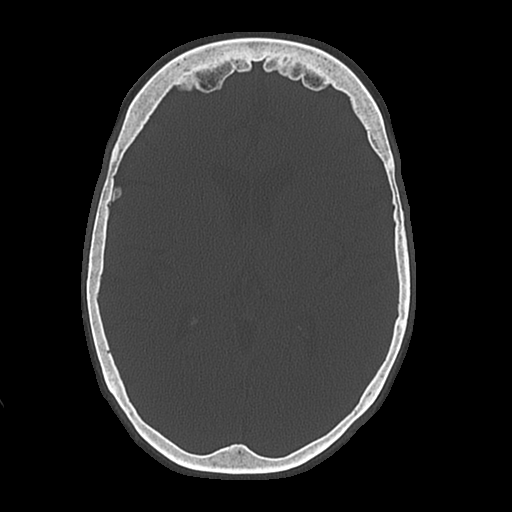

[Series 4: coronal soft · coronal · 0.31mm/px · 3 of 68 slices shown]
[im 23/68  brain]
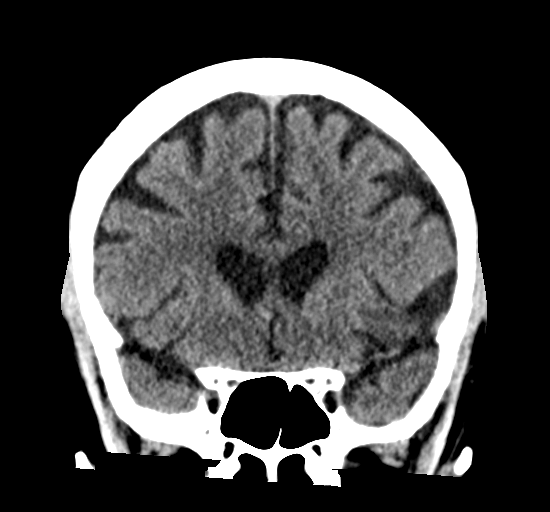
[im 30/68  brain]
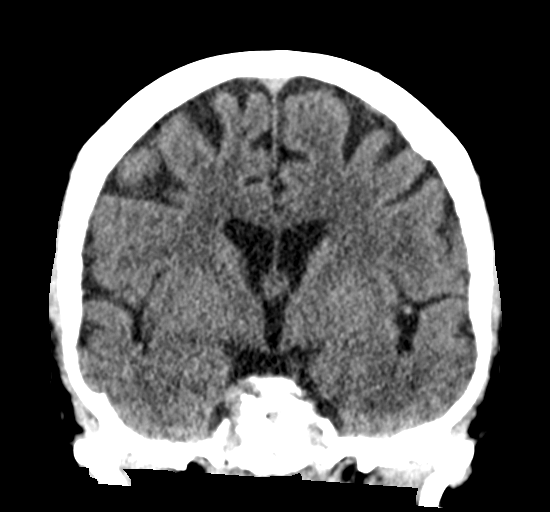
[im 38/68  brain]
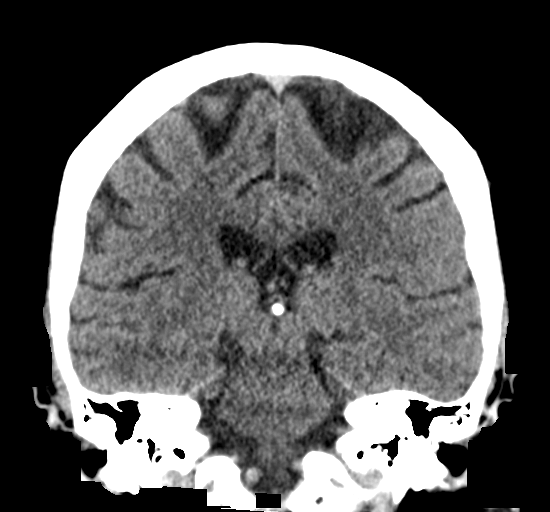

[Series 5: sagittal soft · sagittal · 0.31mm/px · 3 of 58 slices shown]
[im 20/58  brain]
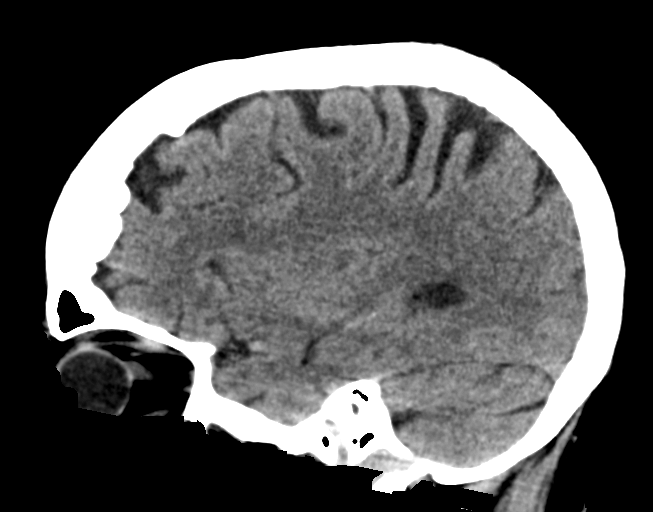
[im 29/58  brain]
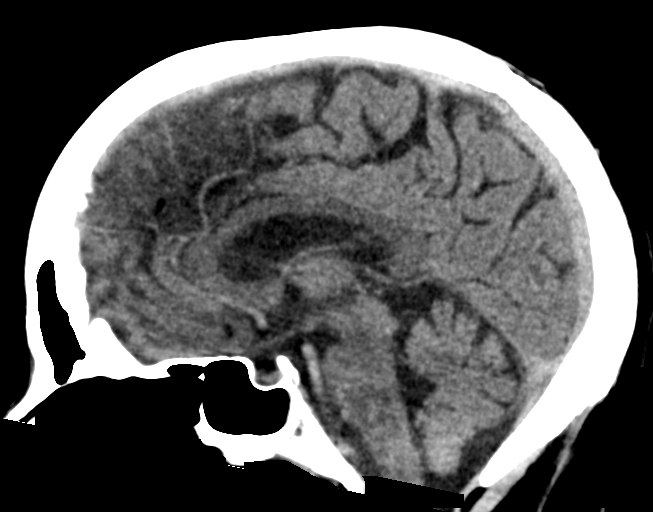
[im 39/58  brain]
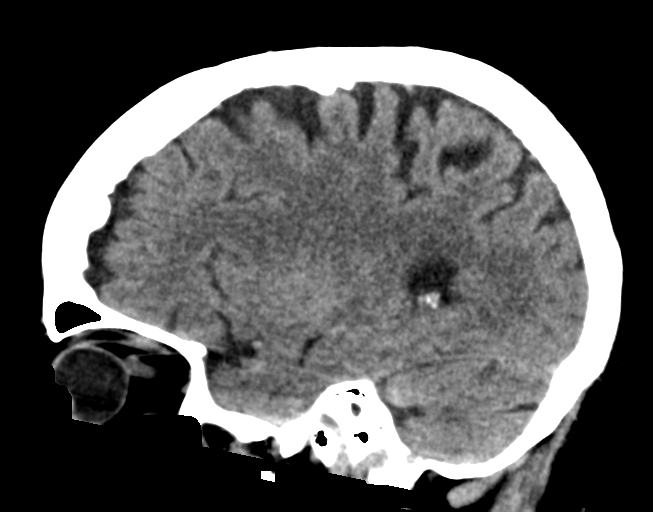

[17 of 47 positions shown; findings below may reference images not displayed]

FINDINGS: Brain: There is no acute intracranial hemorrhage, mass effect, or
edema. Gray-white differentiation is preserved. There is no
extra-axial fluid collection. Ventricles and sulci are within normal
limits in size and configuration.

Vascular: There is atherosclerotic calcification at the skull base.

Skull: Calvarium is unremarkable.

Sinuses/Orbits: No acute finding.

Other: None.
IMPRESSION: No acute or significant abnormality.

## 2022-06-14 ENCOUNTER — Encounter: Payer: Self-pay | Admitting: Physician Assistant

## 2022-06-14 ENCOUNTER — Ambulatory Visit: Payer: Medicare PPO | Attending: Physician Assistant | Admitting: Physician Assistant

## 2022-06-14 VITALS — BP 120/80 | HR 81 | Ht 66.0 in | Wt 241.0 lb

## 2022-06-14 DIAGNOSIS — G4733 Obstructive sleep apnea (adult) (pediatric): Secondary | ICD-10-CM

## 2022-06-14 DIAGNOSIS — E0800 Diabetes mellitus due to underlying condition with hyperosmolarity without nonketotic hyperglycemic-hyperosmolar coma (NKHHC): Secondary | ICD-10-CM

## 2022-06-14 DIAGNOSIS — E785 Hyperlipidemia, unspecified: Secondary | ICD-10-CM | POA: Diagnosis not present

## 2022-06-14 DIAGNOSIS — I10 Essential (primary) hypertension: Secondary | ICD-10-CM | POA: Diagnosis not present

## 2022-06-14 DIAGNOSIS — I2510 Atherosclerotic heart disease of native coronary artery without angina pectoris: Secondary | ICD-10-CM | POA: Diagnosis not present

## 2022-06-14 NOTE — Patient Instructions (Signed)
Medication Instructions:  Your physician recommends that you continue on your current medications as directed. Please refer to the Current Medication list given to you today.  *If you need a refill on your cardiac medications before your next appointment, please call your pharmacy*  Follow-Up: At [facility], you and your health needs are our priority.  As part of our continuing mission to provide you with exceptional heart care, we have created designated Provider Care Teams.  These Care Teams include your primary Cardiologist (physician) and Advanced Practice Providers (APPs -  Physician Assistants and Nurse Practitioners) who all work together to provide you with the care you need, when you need it.  We recommend signing up for the patient portal called "MyChart".  Sign up information is provided on this After Visit Summary.  MyChart is used to connect with patients for Virtual Visits (Telemedicine).  Patients are able to view lab/test results, encounter notes, upcoming appointments, etc.  Non-urgent messages can be sent to your provider as well.   To learn more about what you can do with MyChart, go to [email or web address].    Your next appointment:   6 month(s)  The format for your next appointment:   In Person  Provider:   Fransico Him, MD     Other Instructions Your provider recommends that you maintain 150 minutes per week of moderate aerobic activity.

## 2022-09-04 DIAGNOSIS — N200 Calculus of kidney: Secondary | ICD-10-CM | POA: Diagnosis not present

## 2022-09-04 DIAGNOSIS — N3946 Mixed incontinence: Secondary | ICD-10-CM | POA: Diagnosis not present

## 2022-09-04 DIAGNOSIS — N3020 Other chronic cystitis without hematuria: Secondary | ICD-10-CM | POA: Diagnosis not present

## 2022-09-28 DIAGNOSIS — G4733 Obstructive sleep apnea (adult) (pediatric): Secondary | ICD-10-CM | POA: Diagnosis not present

## 2022-10-05 DIAGNOSIS — N958 Other specified menopausal and perimenopausal disorders: Secondary | ICD-10-CM | POA: Diagnosis not present

## 2022-10-05 DIAGNOSIS — M13862 Other specified arthritis, left knee: Secondary | ICD-10-CM | POA: Diagnosis not present

## 2022-10-05 DIAGNOSIS — Z13820 Encounter for screening for osteoporosis: Secondary | ICD-10-CM | POA: Diagnosis not present

## 2022-10-20 ENCOUNTER — Other Ambulatory Visit (HOSPITAL_COMMUNITY): Payer: Self-pay

## 2022-10-31 ENCOUNTER — Other Ambulatory Visit (HOSPITAL_COMMUNITY): Payer: Self-pay

## 2022-12-11 ENCOUNTER — Ambulatory Visit: Payer: Medicare PPO | Admitting: Cardiology

## 2022-12-28 DIAGNOSIS — G4733 Obstructive sleep apnea (adult) (pediatric): Secondary | ICD-10-CM | POA: Diagnosis not present

## 2023-01-12 DIAGNOSIS — H5213 Myopia, bilateral: Secondary | ICD-10-CM | POA: Diagnosis not present

## 2023-01-12 DIAGNOSIS — H35372 Puckering of macula, left eye: Secondary | ICD-10-CM | POA: Diagnosis not present

## 2023-01-12 DIAGNOSIS — H43813 Vitreous degeneration, bilateral: Secondary | ICD-10-CM | POA: Diagnosis not present

## 2023-01-12 DIAGNOSIS — H04123 Dry eye syndrome of bilateral lacrimal glands: Secondary | ICD-10-CM | POA: Diagnosis not present

## 2023-01-12 DIAGNOSIS — H2513 Age-related nuclear cataract, bilateral: Secondary | ICD-10-CM | POA: Diagnosis not present

## 2023-01-12 DIAGNOSIS — H524 Presbyopia: Secondary | ICD-10-CM | POA: Diagnosis not present

## 2023-01-12 DIAGNOSIS — H25013 Cortical age-related cataract, bilateral: Secondary | ICD-10-CM | POA: Diagnosis not present

## 2023-01-12 DIAGNOSIS — E113293 Type 2 diabetes mellitus with mild nonproliferative diabetic retinopathy without macular edema, bilateral: Secondary | ICD-10-CM | POA: Diagnosis not present

## 2023-01-19 DIAGNOSIS — Z9181 History of falling: Secondary | ICD-10-CM | POA: Diagnosis not present

## 2023-01-19 DIAGNOSIS — Z0000 Encounter for general adult medical examination without abnormal findings: Secondary | ICD-10-CM | POA: Diagnosis not present

## 2023-01-23 DIAGNOSIS — E559 Vitamin D deficiency, unspecified: Secondary | ICD-10-CM | POA: Diagnosis not present

## 2023-01-23 DIAGNOSIS — Z0000 Encounter for general adult medical examination without abnormal findings: Secondary | ICD-10-CM | POA: Diagnosis not present

## 2023-01-23 DIAGNOSIS — E1169 Type 2 diabetes mellitus with other specified complication: Secondary | ICD-10-CM | POA: Diagnosis not present

## 2023-01-23 DIAGNOSIS — R202 Paresthesia of skin: Secondary | ICD-10-CM | POA: Diagnosis not present

## 2023-01-23 DIAGNOSIS — E785 Hyperlipidemia, unspecified: Secondary | ICD-10-CM | POA: Diagnosis not present

## 2023-01-23 DIAGNOSIS — M5416 Radiculopathy, lumbar region: Secondary | ICD-10-CM | POA: Diagnosis not present

## 2023-01-23 DIAGNOSIS — E119 Type 2 diabetes mellitus without complications: Secondary | ICD-10-CM | POA: Diagnosis not present

## 2023-02-22 DIAGNOSIS — Z6838 Body mass index (BMI) 38.0-38.9, adult: Secondary | ICD-10-CM | POA: Diagnosis not present

## 2023-02-22 DIAGNOSIS — E119 Type 2 diabetes mellitus without complications: Secondary | ICD-10-CM | POA: Diagnosis not present

## 2023-02-27 DIAGNOSIS — J029 Acute pharyngitis, unspecified: Secondary | ICD-10-CM | POA: Diagnosis not present

## 2023-03-31 DIAGNOSIS — G4733 Obstructive sleep apnea (adult) (pediatric): Secondary | ICD-10-CM | POA: Diagnosis not present

## 2023-04-06 DIAGNOSIS — Z6838 Body mass index (BMI) 38.0-38.9, adult: Secondary | ICD-10-CM | POA: Diagnosis not present

## 2023-04-06 DIAGNOSIS — J029 Acute pharyngitis, unspecified: Secondary | ICD-10-CM | POA: Diagnosis not present

## 2023-05-04 ENCOUNTER — Other Ambulatory Visit: Payer: Self-pay | Admitting: Cardiology

## 2023-05-10 DIAGNOSIS — Z6838 Body mass index (BMI) 38.0-38.9, adult: Secondary | ICD-10-CM | POA: Diagnosis not present

## 2023-05-10 DIAGNOSIS — N762 Acute vulvitis: Secondary | ICD-10-CM | POA: Diagnosis not present

## 2023-05-10 DIAGNOSIS — Z1231 Encounter for screening mammogram for malignant neoplasm of breast: Secondary | ICD-10-CM | POA: Diagnosis not present

## 2023-05-10 DIAGNOSIS — N952 Postmenopausal atrophic vaginitis: Secondary | ICD-10-CM | POA: Diagnosis not present

## 2023-05-10 DIAGNOSIS — Z01419 Encounter for gynecological examination (general) (routine) without abnormal findings: Secondary | ICD-10-CM | POA: Diagnosis not present

## 2023-05-29 ENCOUNTER — Ambulatory Visit (INDEPENDENT_AMBULATORY_CARE_PROVIDER_SITE_OTHER): Payer: Medicare PPO | Admitting: Neurology

## 2023-05-29 ENCOUNTER — Encounter: Payer: Self-pay | Admitting: Neurology

## 2023-05-29 VITALS — BP 133/85 | HR 80 | Ht 66.0 in | Wt 237.0 lb

## 2023-05-29 DIAGNOSIS — M542 Cervicalgia: Secondary | ICD-10-CM

## 2023-05-29 DIAGNOSIS — M5416 Radiculopathy, lumbar region: Secondary | ICD-10-CM | POA: Diagnosis not present

## 2023-05-29 DIAGNOSIS — G8929 Other chronic pain: Secondary | ICD-10-CM | POA: Diagnosis not present

## 2023-05-29 DIAGNOSIS — M5432 Sciatica, left side: Secondary | ICD-10-CM

## 2023-05-29 DIAGNOSIS — M79605 Pain in left leg: Secondary | ICD-10-CM
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
## 2023-06-07 DIAGNOSIS — Z23 Encounter for immunization: Secondary | ICD-10-CM | POA: Diagnosis not present

## 2023-06-07 DIAGNOSIS — E119 Type 2 diabetes mellitus without complications: Secondary | ICD-10-CM | POA: Diagnosis not present

## 2023-06-07 DIAGNOSIS — E1169 Type 2 diabetes mellitus with other specified complication: Secondary | ICD-10-CM | POA: Diagnosis not present

## 2023-06-29 DIAGNOSIS — G4733 Obstructive sleep apnea (adult) (pediatric): Secondary | ICD-10-CM | POA: Diagnosis not present

## 2023-07-04 DIAGNOSIS — N762 Acute vulvitis: Secondary | ICD-10-CM | POA: Diagnosis not present

## 2023-09-10 DIAGNOSIS — N200 Calculus of kidney: Secondary | ICD-10-CM | POA: Diagnosis not present

## 2023-09-10 DIAGNOSIS — N3020 Other chronic cystitis without hematuria: Secondary | ICD-10-CM | POA: Diagnosis not present

## 2023-09-30 DIAGNOSIS — G4733 Obstructive sleep apnea (adult) (pediatric): Secondary | ICD-10-CM | POA: Diagnosis not present

## 2023-10-15 DIAGNOSIS — I10 Essential (primary) hypertension: Secondary | ICD-10-CM | POA: Diagnosis not present

## 2023-10-15 DIAGNOSIS — E785 Hyperlipidemia, unspecified: Secondary | ICD-10-CM | POA: Diagnosis not present

## 2023-10-15 DIAGNOSIS — E559 Vitamin D deficiency, unspecified: Secondary | ICD-10-CM | POA: Diagnosis not present

## 2023-10-15 DIAGNOSIS — E113299 Type 2 diabetes mellitus with mild nonproliferative diabetic retinopathy without macular edema, unspecified eye: Secondary | ICD-10-CM | POA: Diagnosis not present

## 2023-10-15 DIAGNOSIS — R059 Cough, unspecified: Secondary | ICD-10-CM | POA: Diagnosis not present

## 2023-10-15 DIAGNOSIS — Z6837 Body mass index (BMI) 37.0-37.9, adult: Secondary | ICD-10-CM | POA: Diagnosis not present

## 2023-10-15 DIAGNOSIS — E1169 Type 2 diabetes mellitus with other specified complication: Secondary | ICD-10-CM | POA: Diagnosis not present

## 2023-11-06 ENCOUNTER — Other Ambulatory Visit: Payer: Self-pay

## 2023-11-06 ENCOUNTER — Emergency Department (HOSPITAL_BASED_OUTPATIENT_CLINIC_OR_DEPARTMENT_OTHER): Payer: Medicare PPO

## 2023-11-06 ENCOUNTER — Encounter (HOSPITAL_BASED_OUTPATIENT_CLINIC_OR_DEPARTMENT_OTHER): Payer: Self-pay | Admitting: *Deleted

## 2023-11-06 ENCOUNTER — Emergency Department (HOSPITAL_BASED_OUTPATIENT_CLINIC_OR_DEPARTMENT_OTHER)
Admission: EM | Admit: 2023-11-06 | Discharge: 2023-11-06 | Disposition: A | Discharge status: Home / Self Care | Payer: Medicare PPO | Attending: Emergency Medicine | Admitting: Emergency Medicine

## 2023-11-06 DIAGNOSIS — M25551 Pain in right hip: Secondary | ICD-10-CM | POA: Diagnosis not present

## 2023-11-06 DIAGNOSIS — S0003XA Contusion of scalp, initial encounter: Secondary | ICD-10-CM | POA: Diagnosis not present

## 2023-11-06 DIAGNOSIS — W01198A Fall on same level from slipping, tripping and stumbling with subsequent striking against other object, initial encounter: Secondary | ICD-10-CM | POA: Insufficient documentation

## 2023-11-06 DIAGNOSIS — M47816 Spondylosis without myelopathy or radiculopathy, lumbar region: Secondary | ICD-10-CM | POA: Diagnosis not present

## 2023-11-06 DIAGNOSIS — R519 Headache, unspecified: Secondary | ICD-10-CM | POA: Insufficient documentation

## 2023-11-06 DIAGNOSIS — D1809 Hemangioma of other sites: Secondary | ICD-10-CM | POA: Diagnosis not present

## 2023-11-06 DIAGNOSIS — Z981 Arthrodesis status: Secondary | ICD-10-CM | POA: Diagnosis not present

## 2023-11-06 DIAGNOSIS — M160 Bilateral primary osteoarthritis of hip: Secondary | ICD-10-CM | POA: Diagnosis not present

## 2023-11-06 DIAGNOSIS — Z043 Encounter for examination and observation following other accident: Secondary | ICD-10-CM | POA: Diagnosis not present

## 2023-11-06 DIAGNOSIS — M545 Low back pain, unspecified: Secondary | ICD-10-CM | POA: Insufficient documentation

## 2023-11-06 DIAGNOSIS — Z7982 Long term (current) use of aspirin: Secondary | ICD-10-CM | POA: Diagnosis not present

## 2023-11-06 DIAGNOSIS — Y9389 Activity, other specified: Secondary | ICD-10-CM | POA: Insufficient documentation

## 2023-11-06 DIAGNOSIS — M542 Cervicalgia: Secondary | ICD-10-CM | POA: Insufficient documentation

## 2023-11-06 DIAGNOSIS — W19XXXA Unspecified fall, initial encounter: Secondary | ICD-10-CM

## 2023-11-06 DIAGNOSIS — M5136 Other intervertebral disc degeneration, lumbar region with discogenic back pain only: Secondary | ICD-10-CM | POA: Diagnosis not present

## 2023-11-06 MED ORDER — KETOROLAC TROMETHAMINE 15 MG/ML IJ SOLN
15.0000 mg | Freq: Once | INTRAMUSCULAR | Status: AC
  Administered 2023-11-06: 15 mg via INTRAMUSCULAR
  Filled 2023-11-06: qty 1

## 2023-11-06 NOTE — ED Provider Notes (Signed)
[place] [facility] AT [facility] Provider Note   CSN: [id number] Arrival date & time: 11/06/23  1715     History  Chief Complaint  Patient presents with   Denise [name] is a 73 y.o. female.  73 yo F with a chief complaint of a fall.  The patient states that she was playing with her grandchildren and she tried to do a dance move when she fell and landed on her bottom and struck the back of her head on the ground.  She has had pain to her low back her hips and headache.  Came here for evaluation.   Fall       Home Medications Prior to Admission medications   Medication Sig Start Date End Date Taking? Authorizing Provider  acetaminophen (TYLENOL) 500 MG tablet Take 1,000 mg by mouth every 6 (six) hours as needed for headache (pain).    [provider]  aspirin EC 81 MG tablet Take 1 tablet (81 mg total) by mouth daily. Swallow whole. 05/16/22   Quintella Reichert, MD  cholecalciferol (VITAMIN D) 1000 units tablet Take 1,000 Units by mouth daily.    [provider]  empagliflozin (JARDIANCE) 25 MG TABS tablet Take 25 mg by mouth daily.    [provider]  ibuprofen (ADVIL,MOTRIN) 200 MG tablet Take 400 mg by mouth every 6 (six) hours as needed (pain).    [provider]  indapamide (LOZOL) 1.25 MG tablet Take 1.25 mg by mouth at bedtime.    [provider]  metFORMIN (GLUCOPHAGE-XR) 500 MG 24 hr tablet Take 1,000 mg by mouth 2 (two) times daily.    [provider]  metoprolol succinate (TOPROL-XL) 25 MG 24 hr tablet TAKE 1/2 TABLET (12.5 MG TOTAL) BY MOUTH DAILY. 05/07/23   Quintella Reichert, MD  mirabegron ER (MYRBETRIQ) 50 MG TB24 tablet Take 50 mg by mouth daily.    [provider]  MOUNJARO 2.5 MG/0.5ML Pen Inject 2.5 mg into the skin once a week. 01/23/23   [provider]  Multiple Vitamin (MULTIVITAMIN WITH MINERALS) TABS Take 1 tablet by mouth daily.    [provider]   oxybutynin (DITROPAN) 5 MG tablet Take 1 tablet (5 mg total) by mouth every 8 (eight) hours as needed for bladder spasms. 03/22/14   Jerilee Field, MD  pravastatin (PRAVACHOL) 20 MG tablet Take 20 mg by mouth at bedtime.     [provider]  PRESCRIPTION MEDICATION Inhale into the lungs at bedtime. CPAP    [provider]  sitaGLIPtin (JANUVIA) 25 MG tablet Take 25 mg by mouth daily.    [provider]  VASCEPA 1 g capsule Take 1 capsule by mouth 2 (two) times daily. 11/19/20   [provider]  VITAMIN E PO Take 1 capsule by mouth daily.    [provider]      Allergies    Adhesive [tape], Duloxetine hcl, Sulfa antibiotics, Dulaglutide, and Pioglitazone    Review of Systems   Review of Systems  Physical Exam Updated Vital Signs BP (!) 141/72 (BP Location: Right Arm)   Pulse 82   Temp 97.7 F (36.5 C)   Resp 18   SpO2 99%  Physical Exam Vitals and nursing note reviewed.  Constitutional:      General: She is not in acute distress.    Appearance: She is well-developed. She is not diaphoretic.  HENT:     Head: Normocephalic and  atraumatic.  Eyes:     Pupils: Pupils are equal, round, and reactive to light.  Cardiovascular:     Rate and Rhythm: Normal rate and regular rhythm.     Heart sounds: No murmur heard.    No friction rub. No gallop.  Pulmonary:     Effort: Pulmonary effort is normal.     Breath sounds: No wheezing or rales.  Abdominal:     General: There is no distension.     Palpations: Abdomen is soft.     Tenderness: There is no abdominal tenderness.  Musculoskeletal:        General: No tenderness.     Cervical back: Normal range of motion and neck supple.     Comments: Unable to internally externally rotate bilateral lower extremities without obvious discomfort.  No obvious midline spinal tenderness step-offs or deformities.  Palpated from head to toe without any obvious other noted areas of bony tenderness.  Skin:     General: Skin is warm and dry.  Neurological:     Mental Status: She is alert and oriented to person, place, and time.  Psychiatric:        Behavior: Behavior normal.     ED Results / Procedures / Treatments   Labs (all labs ordered are listed, but only abnormal results are displayed) Labs Reviewed - No data to display  EKG None  Radiology DG Lumbar Spine Complete Result Date: 11/06/2023 CLINICAL DATA:  Low back pain status post fall. EXAM: LUMBAR SPINE - COMPLETE 4+ VIEW COMPARISON:  August 07, 2018. FINDINGS: There is no evidence of lumbar spine fracture. Similar mild levocurvature of the upper lumbar spine. No evidence of static listhesis. Multilevel degenerative disc changes and facet arthropathy of the lower lumbar spine. IMPRESSION: 1. No acute fracture or static listhesis of the lumbar spine 2. Multilevel degenerative disc changes and lower lumbar facet arthropathy. Electronically Signed   By: Hart Robinsons M.D.   On: 11/06/2023 20:26   DG Pelvis 1-2 Views Result Date: 11/06/2023 CLINICAL DATA:  Fall. EXAM: PELVIS - 1-2 VIEW COMPARISON:  Abdominal radiographs dated 09/10/2023. FINDINGS: There is no evidence of pelvic fracture or diastasis. No pelvic bone lesions are seen. Mild degenerative changes of the bilateral hips. The sacroiliac joints and pubic symphysis are anatomically aligned. IMPRESSION: No acute osseous abnormality. Electronically Signed   By: Hart Robinsons M.D.   On: 11/06/2023 20:23   CT Head Wo Contrast Result Date: 11/06/2023 CLINICAL DATA:  Fall EXAM: CT HEAD WITHOUT CONTRAST CT CERVICAL SPINE WITHOUT CONTRAST TECHNIQUE: Multidetector CT imaging of the head and cervical spine was performed following the standard protocol without intravenous contrast. Multiplanar CT image reconstructions of the cervical spine were also generated. RADIATION DOSE REDUCTION: This exam was performed according to the departmental dose-optimization program which includes automated  exposure control, adjustment of the mA and/or kV according to patient size and/or use of iterative reconstruction technique. COMPARISON:  None Available. FINDINGS: CT HEAD FINDINGS Brain: No mass,hemorrhage or extra-axial collection. Normal appearance of the parenchyma and CSF spaces. Vascular: No hyperdense vessel or unexpected vascular calcification. Skull: Posterior midline scalp hematoma.  No skull fracture. Sinuses/Orbits: No fluid levels or advanced mucosal thickening of the visualized paranasal sinuses. No mastoid or middle ear effusion. Normal orbits. Other: None. CT CERVICAL SPINE FINDINGS Alignment: No static subluxation. Facets are aligned. Occipital condyles are normally positioned. Skull base and vertebrae: No acute fracture. C5-7 ACDF. T3 hemangioma. Soft tissues and spinal canal: No prevertebral fluid or swelling.  No visible canal hematoma. Disc levels: No advanced spinal canal or neural foraminal stenosis. Upper chest: No pneumothorax, pulmonary nodule or pleural effusion. Other: Normal visualized paraspinal cervical soft tissues. IMPRESSION: 1. No acute intracranial abnormality. 2. Posterior midline scalp hematoma without skull fracture. 3. No acute fracture or static subluxation of the cervical spine. Electronically Signed   By: Deatra Robinson M.D.   On: 11/06/2023 19:47   CT Cervical Spine Wo Contrast Result Date: 11/06/2023 CLINICAL DATA:  Fall EXAM: CT HEAD WITHOUT CONTRAST CT CERVICAL SPINE WITHOUT CONTRAST TECHNIQUE: Multidetector CT imaging of the head and cervical spine was performed following the standard protocol without intravenous contrast. Multiplanar CT image reconstructions of the cervical spine were also generated. RADIATION DOSE REDUCTION: This exam was performed according to the departmental dose-optimization program which includes automated exposure control, adjustment of the mA and/or kV according to patient size and/or use of iterative reconstruction technique. COMPARISON:   None Available. FINDINGS: CT HEAD FINDINGS Brain: No mass,hemorrhage or extra-axial collection. Normal appearance of the parenchyma and CSF spaces. Vascular: No hyperdense vessel or unexpected vascular calcification. Skull: Posterior midline scalp hematoma.  No skull fracture. Sinuses/Orbits: No fluid levels or advanced mucosal thickening of the visualized paranasal sinuses. No mastoid or middle ear effusion. Normal orbits. Other: None. CT CERVICAL SPINE FINDINGS Alignment: No static subluxation. Facets are aligned. Occipital condyles are normally positioned. Skull base and vertebrae: No acute fracture. C5-7 ACDF. T3 hemangioma. Soft tissues and spinal canal: No prevertebral fluid or swelling. No visible canal hematoma. Disc levels: No advanced spinal canal or neural foraminal stenosis. Upper chest: No pneumothorax, pulmonary nodule or pleural effusion. Other: Normal visualized paraspinal cervical soft tissues. IMPRESSION: 1. No acute intracranial abnormality. 2. Posterior midline scalp hematoma without skull fracture. 3. No acute fracture or static subluxation of the cervical spine. Electronically Signed   By: Deatra Robinson M.D.   On: 11/06/2023 19:47    Procedures Procedures    Medications Ordered in ED Medications  ketorolac (TORADOL) 15 MG/ML injection 15 mg (has no administration in time range)    ED Course/ Medical Decision Making/ A&P                                 Medical Decision Making Amount and/or Complexity of Data Reviewed Radiology: ordered.  Risk Prescription drug management.   73 yo F with a chief complaints of a fall.  Nonsyncopal by history.  Complaining of low back right hip pain neck pain headache.  CT of the head and C-spine are negative for acute intra cranial intraspinal pathology.  Plain film of the pelvis independently interpreted by me without obvious pelvic fracture or hip fracture.  Plain film of the L-spine independently interpreted by me without obvious acute  fracture.  Will treat supportively.  PCP follow-up.  8:56 PM:  I have discussed the diagnosis/risks/treatment options with the patient.  Evaluation and diagnostic testing in the emergency department does not suggest an emergent condition requiring admission or immediate intervention beyond what has been performed at this time.  They will follow up with PCP. We also discussed returning to the ED immediately if new or worsening sx occur. We discussed the sx which are most concerning (e.g., sudden worsening pain, fever, inability to tolerate by mouth, sudden worsening headache, confusion, vomiting) that necessitate immediate return. Medications administered to the patient during their visit and any new prescriptions provided to the patient are listed below.  Medications given during this visit Medications  ketorolac (TORADOL) 15 MG/ML injection 15 mg (has no administration in time range)     The patient appears reasonably screen and/or stabilized for discharge and I doubt any other medical condition or other [facility] requiring further screening, evaluation, or treatment in the ED at this time prior to discharge.          Final Clinical Impression(s) / ED Diagnoses Final diagnoses:  Fall, initial encounter  Acute right-sided low back pain without sciatica  Right hip pain    Rx / DC Orders ED Discharge Orders     None         Melene Plan, DO 11/06/23 2056

## 2023-11-06 NOTE — ED Triage Notes (Signed)
Pt tripped and fell backwards while playing with her grandson.  Pt struck her head on the floor.  No LOC, not on blood thinners.  Pt has pain in her head and bilateral hips and lower back.  Pt is alert and oriented.

## 2023-11-06 NOTE — Discharge Instructions (Signed)
You will hurt worse tomorrow, that is normal.  Typically would have you follow-up with the family doctor in about a week.  If you are still having significant discomfort then they may consider reimaging you.  Please return for sudden worsening headache confusion or persistent vomiting.  Max OTC dosing below. Take 4 over the counter ibuprofen tablets 3 times a day or 2 over-the-counter naproxen tablets twice a day for pain. Also take tylenol 1000mg (2 extra strength) four times a day.

## 2024-01-02 DIAGNOSIS — G4733 Obstructive sleep apnea (adult) (pediatric): Secondary | ICD-10-CM | POA: Diagnosis not present

## 2024-01-21 DIAGNOSIS — H01005 Unspecified blepharitis left lower eyelid: Secondary | ICD-10-CM | POA: Diagnosis not present

## 2024-01-21 DIAGNOSIS — H00015 Hordeolum externum left lower eyelid: Secondary | ICD-10-CM | POA: Diagnosis not present

## 2024-01-21 DIAGNOSIS — H01002 Unspecified blepharitis right lower eyelid: Secondary | ICD-10-CM | POA: Diagnosis not present

## 2024-01-24 DIAGNOSIS — E113299 Type 2 diabetes mellitus with mild nonproliferative diabetic retinopathy without macular edema, unspecified eye: Secondary | ICD-10-CM | POA: Diagnosis not present

## 2024-01-24 DIAGNOSIS — E559 Vitamin D deficiency, unspecified: Secondary | ICD-10-CM | POA: Diagnosis not present

## 2024-01-24 DIAGNOSIS — M791 Myalgia, unspecified site: Secondary | ICD-10-CM | POA: Diagnosis not present

## 2024-01-24 DIAGNOSIS — Z0000 Encounter for general adult medical examination without abnormal findings: Secondary | ICD-10-CM | POA: Diagnosis not present

## 2024-01-24 DIAGNOSIS — E785 Hyperlipidemia, unspecified: Secondary | ICD-10-CM | POA: Diagnosis not present

## 2024-01-24 DIAGNOSIS — G629 Polyneuropathy, unspecified: Secondary | ICD-10-CM | POA: Diagnosis not present

## 2024-01-24 DIAGNOSIS — M5430 Sciatica, unspecified side: Secondary | ICD-10-CM | POA: Diagnosis not present

## 2024-01-24 DIAGNOSIS — Z6835 Body mass index (BMI) 35.0-35.9, adult: Secondary | ICD-10-CM | POA: Diagnosis not present

## 2024-01-24 DIAGNOSIS — E1169 Type 2 diabetes mellitus with other specified complication: Secondary | ICD-10-CM | POA: Diagnosis not present

## 2024-01-24 DIAGNOSIS — I10 Essential (primary) hypertension: Secondary | ICD-10-CM | POA: Diagnosis not present

## 2024-04-09 DIAGNOSIS — G4733 Obstructive sleep apnea (adult) (pediatric): Secondary | ICD-10-CM | POA: Diagnosis not present

## 2024-04-24 DIAGNOSIS — E119 Type 2 diabetes mellitus without complications: Secondary | ICD-10-CM | POA: Diagnosis not present

## 2024-04-24 DIAGNOSIS — H0100A Unspecified blepharitis right eye, upper and lower eyelids: Secondary | ICD-10-CM | POA: Diagnosis not present

## 2024-04-24 DIAGNOSIS — H5213 Myopia, bilateral: Secondary | ICD-10-CM | POA: Diagnosis not present

## 2024-04-24 DIAGNOSIS — H2513 Age-related nuclear cataract, bilateral: Secondary | ICD-10-CM | POA: Diagnosis not present

## 2024-04-24 DIAGNOSIS — H0100B Unspecified blepharitis left eye, upper and lower eyelids: Secondary | ICD-10-CM | POA: Diagnosis not present

## 2024-05-27 DIAGNOSIS — N952 Postmenopausal atrophic vaginitis: Secondary | ICD-10-CM | POA: Diagnosis not present

## 2024-05-27 DIAGNOSIS — Z6835 Body mass index (BMI) 35.0-35.9, adult: Secondary | ICD-10-CM | POA: Diagnosis not present

## 2024-05-27 DIAGNOSIS — Z1231 Encounter for screening mammogram for malignant neoplasm of breast: Secondary | ICD-10-CM | POA: Diagnosis not present

## 2024-05-27 DIAGNOSIS — Z01419 Encounter for gynecological examination (general) (routine) without abnormal findings: Secondary | ICD-10-CM | POA: Diagnosis not present

## 2024-06-02 DIAGNOSIS — G479 Sleep disorder, unspecified: Secondary | ICD-10-CM | POA: Diagnosis not present

## 2024-06-02 DIAGNOSIS — R42 Dizziness and giddiness: Secondary | ICD-10-CM | POA: Diagnosis not present

## 2024-06-02 DIAGNOSIS — K219 Gastro-esophageal reflux disease without esophagitis: Secondary | ICD-10-CM | POA: Diagnosis not present

## 2024-06-02 DIAGNOSIS — M5430 Sciatica, unspecified side: Secondary | ICD-10-CM | POA: Diagnosis not present

## 2024-06-02 DIAGNOSIS — Z6835 Body mass index (BMI) 35.0-35.9, adult: Secondary | ICD-10-CM | POA: Diagnosis not present

## 2024-06-02 DIAGNOSIS — Z23 Encounter for immunization: Secondary | ICD-10-CM | POA: Diagnosis not present

## 2024-06-02 DIAGNOSIS — F0781 Postconcussional syndrome: Secondary | ICD-10-CM | POA: Diagnosis not present

## 2024-06-02 DIAGNOSIS — R519 Headache, unspecified: Secondary | ICD-10-CM | POA: Diagnosis not present

## 2024-06-16 DIAGNOSIS — M5416 Radiculopathy, lumbar region: Secondary | ICD-10-CM | POA: Diagnosis not present

## 2024-06-26 DIAGNOSIS — M5416 Radiculopathy, lumbar region: Secondary | ICD-10-CM | POA: Diagnosis not present

## 2024-07-09 DIAGNOSIS — M5416 Radiculopathy, lumbar region: Secondary | ICD-10-CM | POA: Diagnosis not present

## 2024-07-09 DIAGNOSIS — M25552 Pain in left hip: Secondary | ICD-10-CM | POA: Diagnosis not present

## 2024-07-09 DIAGNOSIS — M1612 Unilateral primary osteoarthritis, left hip: Secondary | ICD-10-CM | POA: Diagnosis not present

## 2024-07-09 DIAGNOSIS — M545 Low back pain, unspecified: Secondary | ICD-10-CM | POA: Diagnosis not present

## 2024-08-01 ENCOUNTER — Other Ambulatory Visit: Payer: Self-pay | Admitting: Neurosurgery

## 2024-08-01 DIAGNOSIS — M5416 Radiculopathy, lumbar region: Secondary | ICD-10-CM

## 2024-08-04 ENCOUNTER — Encounter: Payer: Self-pay | Admitting: Neurosurgery

## 2024-08-05 ENCOUNTER — Ambulatory Visit
Admission: RE | Admit: 2024-08-05 | Discharge: 2024-08-05 | Disposition: A | Discharge status: Home / Self Care | Source: Ambulatory Visit | Attending: Neurosurgery | Admitting: Neurosurgery

## 2024-08-05 DIAGNOSIS — M5416 Radiculopathy, lumbar region: Secondary | ICD-10-CM

## 2024-08-05 DIAGNOSIS — M545 Low back pain, unspecified: Secondary | ICD-10-CM | POA: Diagnosis not present
